# Patient Record
Sex: Male | Born: 1970 | Race: White | Hispanic: No | Marital: Married | Smoking: Former smoker
Health system: Southern US, Community
[De-identification: ages and names within clinical notes are randomized; demographics above are authoritative.]

## PROBLEM LIST (undated history)

## (undated) DIAGNOSIS — E785 Hyperlipidemia, unspecified: Secondary | ICD-10-CM

## (undated) DIAGNOSIS — J309 Allergic rhinitis, unspecified: Secondary | ICD-10-CM

## (undated) DIAGNOSIS — M545 Low back pain, unspecified: Secondary | ICD-10-CM

## (undated) DIAGNOSIS — S3609XA Other injury of spleen, initial encounter: Secondary | ICD-10-CM

## (undated) DIAGNOSIS — R350 Frequency of micturition: Secondary | ICD-10-CM

## (undated) DIAGNOSIS — T7840XA Allergy, unspecified, initial encounter: Secondary | ICD-10-CM

## (undated) DIAGNOSIS — A630 Anogenital (venereal) warts: Secondary | ICD-10-CM

## (undated) HISTORY — DX: Allergic rhinitis, unspecified: J30.9

## (undated) HISTORY — DX: Other injury of spleen, initial encounter: S36.09XA

## (undated) HISTORY — DX: Anogenital (venereal) warts: A63.0

## (undated) HISTORY — DX: Allergy, unspecified, initial encounter: T78.40XA

## (undated) HISTORY — DX: Low back pain: M54.5

## (undated) HISTORY — DX: Frequency of micturition: R35.0

## (undated) HISTORY — DX: Low back pain, unspecified: M54.50

## (undated) HISTORY — PX: TONSILLECTOMY: SHX5217

## (undated) HISTORY — DX: Hyperlipidemia, unspecified: E78.5

---

## 2003-05-14 HISTORY — PX: VARICOCELECTOMY: SHX1084

## 2005-05-13 HISTORY — PX: CYSTOSCOPY: SHX5120

## 2005-12-13 ENCOUNTER — Ambulatory Visit: Payer: Self-pay | Admitting: Cardiology

## 2008-10-21 ENCOUNTER — Encounter (INDEPENDENT_AMBULATORY_CARE_PROVIDER_SITE_OTHER): Payer: Self-pay | Admitting: *Deleted

## 2010-09-03 ENCOUNTER — Other Ambulatory Visit: Payer: Self-pay | Admitting: Family Medicine

## 2010-09-03 ENCOUNTER — Ambulatory Visit
Admission: RE | Admit: 2010-09-03 | Discharge: 2010-09-03 | Disposition: A | Discharge status: Home / Self Care | Payer: BC Managed Care – PPO | Source: Ambulatory Visit | Attending: Family Medicine | Admitting: Family Medicine

## 2010-09-03 DIAGNOSIS — M545 Low back pain, unspecified: Secondary | ICD-10-CM

## 2010-11-28 ENCOUNTER — Ambulatory Visit: Payer: Self-pay | Admitting: Family Medicine

## 2010-12-10 ENCOUNTER — Ambulatory Visit (INDEPENDENT_AMBULATORY_CARE_PROVIDER_SITE_OTHER): Payer: BC Managed Care – PPO | Admitting: Family Medicine

## 2010-12-10 ENCOUNTER — Encounter: Payer: Self-pay | Admitting: Family Medicine

## 2010-12-10 VITALS — BP 112/78 | HR 64 | Ht 71.0 in | Wt 175.0 lb

## 2010-12-10 DIAGNOSIS — M545 Low back pain, unspecified: Secondary | ICD-10-CM

## 2010-12-10 LAB — POCT URINALYSIS DIPSTICK
Blood, UA: NEGATIVE
Ketones, UA: NEGATIVE
Leukocytes, UA: NEGATIVE
Protein, UA: NEGATIVE
pH, UA: 7

## 2010-12-10 MED ORDER — NAPROXEN 500 MG PO TABS: 500.0000 mg | ORAL_TABLET | Freq: Two times a day (BID) | ORAL | Status: DC

## 2010-12-10 NOTE — Patient Instructions (Signed)
Back Pain (Lumbosacral Strain) Back pain is one of the most common causes of pain. There are many causes of back pain. Most are not serious conditions.  CAUSES Your backbone (spinal column) is made up of 24 main vertebral bodies, the sacrum, and the coccyx. These are held together by muscles and tough, fibrous tissue (ligaments). Nerve roots pass through the openings between the vertebrae. A sudden move or injury to the back may cause injury to, or pressure on, these nerves. This may result in localized back pain or pain movement (radiation) into the buttocks, down the leg, and into the foot. Sharp, shooting pain from the buttock down the back of the leg (sciatica) is frequently associated with a ruptured (herniated) disc. Pain may be caused by muscle spasm alone. Your caregiver can often find the cause of your pain by the details of your symptoms and an exam. In some cases, you may need tests (such as X-rays). Your caregiver will work with you to decide if any tests are needed based on your specific exam. HOME CARE INSTRUCTIONS  Avoid an underactive lifestyle. Active exercise, as directed by your caregiver, is your greatest weapon against back pain.   Avoid hard physical activities (tennis, racquetball, water-skiing) if you are not in proper physical condition for it. This may aggravate and/or create problems.   If you have a back problem, avoid sports requiring sudden body movements. Swimming and walking are generally safer activities.   Maintain good posture.   Avoid becoming overweight (obese).   Use bed rest for only the most extreme, sudden (acute) episode. Your caregiver will help you determine how much bed rest is necessary.   For acute conditions, you may put ice on the injured area.   Put ice in a plastic bag.   Place a towel between your skin and the bag.   Leave the ice on for 15 minutes at a time, every 2 hours, or as needed.   After you are improved and more active, it may  help to apply heat for 30 minutes before activities.  See your caregiver if you are having pain that lasts longer than expected. Your caregiver can advise appropriate exercises and/or therapy if needed. With conditioning, most back problems can be avoided. SEEK IMMEDIATE MEDICAL CARE IF:  You have numbness, tingling, weakness, or problems with the use of your arms or legs.   You experience severe back pain not relieved with medicines.   There is a change in bowel or bladder control.   You have increasing pain in any area of the body, including your belly (abdomen).   You notice shortness of breath, dizziness, or feel faint.   You feel sick to your stomach (nauseous), are throwing up (vomiting), or become sweaty.   You notice discoloration of your toes or legs, or your feet get very cold.   Your back pain is getting worse.   You have an oral temperature above 102, not controlled by medicine.  MAKE SURE YOU:   Understand these instructions.   Will watch your condition.   Will get help right away if you are not doing well or get worse.  Document Released: 02/06/2005 Document Re-Released: 07/24/2009 [facility] Patient Information 2011 [place], [place].

## 2010-12-10 NOTE — Progress Notes (Signed)
Chief Complaint:  Low back pain  Back pain began about a year ago, got better.  Flared again 6 months ago, and worsening over the last 4 months.  Pain is bilateral lower back. Pain radiates down to the calves when he extends his back.  Denies numbness/tingling, denies weakness.  Denies burning pain. He has seen Dr. Genene Churn (chiropractor) and gotten various treatments (including traction, per pts report).  Has not used any NSAIDs and prefers to use more natural, organic type treatments.  Would prefer to avoid surgery, steroid injections, and even medications, if possible, although he realizes at this point, having not improved with chiro treatments alone, that he will need to adjust these preferences.  Hurts getting out of bed in the morning, and with certain activities.  He is still going to the gym, but has avoided lower extremity weights, and quit running (per recommendation from chiro).  He has tried pilates and massage therapy as well. He reports that  X-rays shows DJD. Denies any bowel/bladder dysfunction.  History reviewed. No pertinent past medical history. No current outpatient prescriptions on file prior to visit.  No Known Allergies  ROS:  Denies fevers, URI symptoms, SOB, numbness/tingling/weakness, skin rash or other concerns  PHYSICAL EXAM: BP 112/78  Pulse 64  Ht 5\' 11"  (1.803 m)  Wt 175 lb (79.379 kg)  BMI 24.41 kg/m2 Well developed, pleasant male, in no distress Back: no spinal tenderness, no SI joint tenderness, no CVA tenderness.  No muscle spasm or paraspinal muscle tenderness.  No tenderness at sciatic notch . DTR's are 2+ and symmetric.  Normal strength, sensation.  Straight leg raise--caused pain in low back (bilateral) only.  Did not cause any radiation of pain into lower extremities Extremities: 2+ distal pulse, no edema Skin: without rash Psych: normal mood, affect, hygiene and grooming  ASSESSMENT/PLAN: 1. LBP (low back pain)  POCT urinalysis dipstick,  naproxen (NAPROSYN) 500 MG tablet, Ambulatory referral to Physical Therapy   NSAID precautions reviewed at length.  Will refer to [facility] (or [facility] if insurance issue).  Given normal exam with lack of neuro findings, will proceed with PT and NSAIDs before proceeding with MRI of L-S spine.  If no improvement, consider MRI  F/u at [facility] as already scheduled for September, sooner prn

## 2011-01-16 ENCOUNTER — Telehealth: Payer: Self-pay | Admitting: *Deleted

## 2011-01-16 ENCOUNTER — Ambulatory Visit (INDEPENDENT_AMBULATORY_CARE_PROVIDER_SITE_OTHER): Payer: BC Managed Care – PPO | Admitting: Family Medicine

## 2011-01-16 ENCOUNTER — Encounter: Payer: Self-pay | Admitting: Family Medicine

## 2011-01-16 VITALS — BP 110/70 | HR 60 | Ht 71.0 in | Wt 173.0 lb

## 2011-01-16 DIAGNOSIS — Z79899 Other long term (current) drug therapy: Secondary | ICD-10-CM

## 2011-01-16 DIAGNOSIS — J309 Allergic rhinitis, unspecified: Secondary | ICD-10-CM | POA: Insufficient documentation

## 2011-01-16 DIAGNOSIS — Z23 Encounter for immunization: Secondary | ICD-10-CM

## 2011-01-16 DIAGNOSIS — Z0000 Encounter for general adult medical examination without abnormal findings: Secondary | ICD-10-CM

## 2011-01-16 DIAGNOSIS — E7800 Pure hypercholesterolemia, unspecified: Secondary | ICD-10-CM

## 2011-01-16 DIAGNOSIS — M545 Low back pain: Secondary | ICD-10-CM

## 2011-01-16 LAB — COMPREHENSIVE METABOLIC PANEL
AST: 31 U/L (ref 0–37)
Alkaline Phosphatase: 48 U/L (ref 39–117)
BUN: 10 mg/dL (ref 6–23)
Calcium: 10.2 mg/dL (ref 8.4–10.5)
Chloride: 102 mEq/L (ref 96–112)
Creat: 0.93 mg/dL (ref 0.50–1.35)

## 2011-01-16 LAB — POCT URINALYSIS DIPSTICK
Bilirubin, UA: NEGATIVE
Blood, UA: NEGATIVE
Glucose, UA: NEGATIVE
Ketones, UA: NEGATIVE
Nitrite, UA: NEGATIVE
Spec Grav, UA: 1.01
pH, UA: 5

## 2011-01-16 LAB — LIPID PANEL
HDL: 56 mg/dL (ref >39)
Total CHOL/HDL Ratio: 3.7 Ratio
Triglycerides: 86 mg/dL (ref <150)

## 2011-01-16 MED ORDER — NAPROXEN 500 MG PO TABS: 500.0000 mg | ORAL_TABLET | Freq: Two times a day (BID) | ORAL | Status: AC

## 2011-01-16 NOTE — Progress Notes (Signed)
Kevin Lowe is a 40 y.o. male who presents for a complete physical.  He has the following concerns: Follow-up back pain.  He has been getting PT with Aart Schulenklopper, and also took Naproxen x 2 weeks.  Felt great while taking the NSAIDs, but had gradual return of pain since off NSAIDs.  Less pain in the leg, but still having pain in lower back.  Only occasional pain into the legs, overall improved. Denies weakness.  Would like MRI, and to restart NSAIDs   There is no immunization history on file for this patient. Will check old records for tetanus date--he can't recall Last colonoscopy: never Last PSA: N/A Dentist: twice yearly Ophtho: never Exercise: running 1-2 days/week, and weights and cardio 4x/week; since back pain x 4 mos, exercise has decreased, mainly weight training 3x/week,   Past Medical History  Diagnosis Date  . Low back pain   . Allergic rhinitis, cause unspecified   . Hyperlipidemia     Past Surgical History  Procedure Date  . Varicocelectomy 2005    L side  . Cystoscopy 2007    normal--for urinary frequency  . Tonsillectomy child    History   Social History  . Marital Status: Married    Spouse Name: N/A    Number of Children: N/A  . Years of Education: N/A   Occupational History  . Not on file.   Social History Main Topics  . Smoking status: Former Smoker    Quit date: 05/13/2000  . Smokeless tobacco: Never Used  . Alcohol Use: Yes     12 pack or more per week (6 pack/day on weekends)  . Drug Use: No  . Sexually Active: Not on file   Other Topics Concern  . Not on file   Social History Narrative  . No narrative on file    Family History  Problem Relation Age of Onset  . Cancer Mother     breast  . Heart disease Father     CABG in early 41's; pacemaker  . Depression Father   . Cancer Father 63    prostate cancer  . Heart disease Paternal Uncle     32's (died in 13's)  . Alzheimer's disease Maternal Grandmother   . Heart disease  Paternal Grandfather     died in mid-40's from heart disease  . Diabetes Neg Hx    Current outpatient prescriptions:fish oil-omega-3 fatty acids 1000 MG capsule, Take 2 g by mouth daily.  , Disp: , Rfl: ;  Multiple Vitamins-Minerals (MULTIVITAMIN WITH MINERALS) tablet, Take 1 tablet by mouth daily.  , Disp: , Rfl: ;  Ascorbic Acid (VITAMIN C) 1000 MG tablet, Take 1,000 mg by mouth daily.  , Disp: , Rfl:  fluticasone (FLONASE) 50 MCG/ACT nasal spray, Place 2 sprays into the nose daily. Uses prn for seasonal allergies , Disp: , Rfl:   No Known Allergies  ROS: The patient denies anorexia, fever,  headaches,  vision loss, decreased hearing, ear pain, hoarseness, chest pain, palpitations, dizziness, syncope, dyspnea on exertion, cough, swelling, nausea, vomiting, diarrhea, constipation, abdominal pain, melena, hematochezia, indigestion/heartburn, hematuria, incontinence, erectile dysfunction, nocturia, weakened urine stream, dysuria, genital lesions, numbness, tingling, weakness, tremor, suspicious skin lesions, depression, abnormal bleeding/bruising, or enlarged lymph nodes  Intentional 8-10 pound weight loss over the last 6 months (healthy diet changes); occasional trouble sleeping related to anxiety. +back pain  PHYSICAL EXAM: BP 110/70  Pulse 60  Ht 5\' 11"  (1.803 m)  Wt 173 lb (78.472 kg)  BMI 24.13 kg/m2  General Appearance:    Alert, cooperative, no distress, appears stated age  Head:    Normocephalic, without obvious abnormality, atraumatic  Eyes:    PERRL, conjunctiva/corneas clear, EOM's intact, fundi    benign  Ears:    Normal TM's and external ear canals  Nose:   Nares normal, mucosa normal, no drainage or sinus   tenderness  Throat:   Lips, mucosa, and tongue normal; teeth and gums normal  Neck:   Supple, no lymphadenopathy;  thyroid:  no   enlargement/tenderness/nodules; no carotid   bruit or JVD  Back:    Spine nontender, no curvature, ROM normal, no CVA     Tenderness.  Area of  pain is across low back bilaterally, but nontender  Lungs:     Clear to auscultation bilaterally without wheezes, rales or     ronchi; respirations unlabored  Chest Wall:    No tenderness or deformity   Heart:    Regular rate and rhythm, S1 and S2 normal, no murmur, rub   or gallop  Breast Exam:    No chest wall tenderness, masses or gynecomastia  Abdomen:     Soft, non-tender, nondistended, normoactive bowel sounds,    no masses, no hepatosplenomegaly  Genitalia:    Normal male external genitalia without lesions.  Testicles without masses.  No inguinal hernias.  Rectal:   Deferred due to age <40 and lack of symptoms  Extremities:   No clubbing, cyanosis or edema  Pulses:   2+ and symmetric all extremities  Skin:   Skin color, texture, turgor normal, no rashes or lesions  Lymph nodes:   Cervical, supraclavicular, and axillary nodes normal  Neurologic:   CNII-XII intact, normal strength, sensation and gait; reflexes 2+ and symmetric throughout. Negative straight leg raise (some discomfort in low back, not in legs)          Psych:   Normal mood, affect, hygiene and grooming.     ASSESSMENT/PLAN: 1. Routine general medical examination at a health care facility  POCT Urinalysis Dipstick, Visual acuity screening  2. Low back pain  naproxen (NAPROSYN) 500 MG tablet, MR Lumbar Spine Wo Contrast   temporarily improved with NSAIDs, but recurred, despite PT.  Check MRI  3. Allergic rhinitis, cause unspecified     currently asymptomatic.  Restart Flonase and antihistamines when symptoms recur  4. Encounter for long-term (current) use of other medications  Comprehensive metabolic panel  5. Pure hypercholesterolemia  Lipid panel   improved with dietary measures in the past.  Normal HS-CRP in past--due for re-check of lipids   Recommended at least 30 minutes of aerobic activity at least 5 days/week; proper sunscreen use reviewed; PSA screening will be discussed at age 28 (due to family history);  healthy diet and alcohol recommendations (less than or equal to 2 drinks/day) reviewed; regular seatbelt use; changing batteries in smoke detectors. Self-testicular exams. Immunization recommendations discussed--old records reviewed, and no mention of Td or Tdap.  Will need to return for TdaP at nurse visit.  Colonoscopy recommendations reviewed--age 52.

## 2011-01-16 NOTE — Patient Instructions (Addendum)

## 2011-01-16 NOTE — Telephone Encounter (Signed)
Called patient and left message for patient regarding his MRI appt. He is scheduled at [facility] @ [address] 01/21/11 arrive @ 7:15am for a 7:45am scan. Also called his [employer] and got a prior auth, the number is [phone] good today-02/14/11, called Kristy at [facility] to give her this number.

## 2011-01-16 NOTE — Telephone Encounter (Signed)
Called patient to ask him when his last tetanus injection was. Patient states that he has no idea.

## 2011-01-16 NOTE — Telephone Encounter (Signed)
Called patient and left him a message to come in for a Tdap when he could and to call for an appointment for this injection.

## 2011-01-16 NOTE — Telephone Encounter (Signed)
Advise pt that I reviewed records sent from [facility].  I didn't give him tetanus there--if he doesn't know, and is likely >10 years, then have him come for TdaP at his convenience

## 2011-01-17 ENCOUNTER — Encounter: Payer: Self-pay | Admitting: Family Medicine

## 2011-01-23 ENCOUNTER — Encounter: Payer: Self-pay | Admitting: Family Medicine

## 2011-01-23 ENCOUNTER — Ambulatory Visit
Admission: RE | Admit: 2011-01-23 | Discharge: 2011-01-23 | Disposition: A | Discharge status: Home / Self Care | Payer: BC Managed Care – PPO | Source: Ambulatory Visit | Attending: Family Medicine | Admitting: Family Medicine

## 2011-01-23 DIAGNOSIS — M545 Low back pain: Secondary | ICD-10-CM

## 2011-01-25 ENCOUNTER — Other Ambulatory Visit (INDEPENDENT_AMBULATORY_CARE_PROVIDER_SITE_OTHER): Payer: BC Managed Care – PPO

## 2011-01-25 DIAGNOSIS — Z9229 Personal history of other drug therapy: Secondary | ICD-10-CM

## 2011-01-25 DIAGNOSIS — Z23 Encounter for immunization: Secondary | ICD-10-CM

## 2011-02-04 ENCOUNTER — Telehealth: Payer: Self-pay | Admitting: *Deleted

## 2011-02-04 NOTE — Telephone Encounter (Signed)
Called patient and let him know that the other group that Dr.Knapp had mentioned was [facility], and she is familiar with Dr's Lovell Sheehan and [employer].

## 2011-07-22 ENCOUNTER — Other Ambulatory Visit: Payer: BC Managed Care – PPO

## 2011-07-22 DIAGNOSIS — E7800 Pure hypercholesterolemia, unspecified: Secondary | ICD-10-CM

## 2011-07-23 LAB — LIPID PANEL
Cholesterol: 204 mg/dL — ABNORMAL HIGH (ref 0–200)
VLDL: 22 mg/dL (ref 0–40)

## 2011-07-24 ENCOUNTER — Other Ambulatory Visit: Payer: Self-pay | Admitting: *Deleted

## 2011-07-24 DIAGNOSIS — E7800 Pure hypercholesterolemia, unspecified: Secondary | ICD-10-CM

## 2011-07-25 ENCOUNTER — Other Ambulatory Visit: Payer: Self-pay | Admitting: *Deleted

## 2012-01-22 ENCOUNTER — Ambulatory Visit
Admission: RE | Admit: 2012-01-22 | Discharge: 2012-01-22 | Disposition: A | Discharge status: Home / Self Care | Payer: BC Managed Care – PPO | Source: Ambulatory Visit | Attending: Neurosurgery | Admitting: Neurosurgery

## 2012-01-22 ENCOUNTER — Other Ambulatory Visit: Payer: Self-pay | Admitting: Neurosurgery

## 2012-01-22 DIAGNOSIS — M545 Low back pain: Secondary | ICD-10-CM

## 2012-01-23 ENCOUNTER — Other Ambulatory Visit: Payer: Self-pay | Admitting: Neurosurgery

## 2012-02-04 ENCOUNTER — Encounter (HOSPITAL_COMMUNITY): Payer: Self-pay | Admitting: Pharmacy Technician

## 2012-02-11 ENCOUNTER — Encounter (HOSPITAL_COMMUNITY): Payer: Self-pay

## 2012-02-11 ENCOUNTER — Encounter (HOSPITAL_COMMUNITY)
Admission: RE | Admit: 2012-02-11 | Discharge: 2012-02-11 | Disposition: A | Discharge status: Home / Self Care | Payer: BC Managed Care – PPO | Source: Ambulatory Visit | Attending: Neurosurgery | Admitting: Neurosurgery

## 2012-02-11 HISTORY — PX: LUMBAR LAMINECTOMY/DECOMPRESSION MICRODISCECTOMY: SHX5026

## 2012-02-11 LAB — BASIC METABOLIC PANEL
BUN: 13 mg/dL (ref 6–23)
CO2: 30 mEq/L (ref 19–32)
Chloride: 101 mEq/L (ref 96–112)
Creatinine, Ser: 0.9 mg/dL (ref 0.50–1.35)
Glucose, Bld: 103 mg/dL — ABNORMAL HIGH (ref 70–99)
Potassium: 4.1 mEq/L (ref 3.5–5.1)

## 2012-02-11 LAB — CBC
HCT: 46.5 % (ref 39.0–52.0)
Hemoglobin: 16.6 g/dL (ref 13.0–17.0)
MCH: 31.4 pg (ref 26.0–34.0)
MCHC: 35.7 g/dL (ref 30.0–36.0)
MCV: 88.1 fL (ref 78.0–100.0)
RDW: 11.8 % (ref 11.5–15.5)

## 2012-02-11 LAB — SURGICAL PCR SCREEN: MRSA, PCR: NEGATIVE

## 2012-02-11 NOTE — Pre-Procedure Instructions (Signed)
20 MAHAMUD FRENZ  02/11/2012   Your procedure is scheduled on:  02/18/12  Report to [facility] at 1240 AM.  Call this number if you have problems the morning of surgery: [phone]   Remember:   Do not eat food:After Midnight.     Take these medicines the morning of surgery with A SIP OF WATER: none   Do not wear jewelry, make-up or nail polish.  Do not wear lotions, powders, or perfumes. You may wear deodorant.  Do not shave 48 hours prior to surgery. Men may shave face and neck.  Do not bring valuables to the hospital.  Contacts, dentures or bridgework may not be worn into surgery.  Leave suitcase in the car. After surgery it may be brought to your room.  For patients admitted to the hospital, checkout time is 11:00 AM the day of discharge.   Patients discharged the day of surgery will not be allowed to drive home.  Name and phone number of your driver: family  Special Instructions: Shower using CHG 2 nights before surgery and the night before surgery.  If you shower the day of surgery use CHG.  Use special wash - you have one bottle of CHG for all showers.  You should use approximately 1/3 of the bottle for each shower.   Please read over the following fact sheets that you were given: Pain Booklet, Coughing and Deep Breathing, MRSA Information and Surgical Site Infection Prevention

## 2012-02-17 MED ORDER — CEFAZOLIN SODIUM-DEXTROSE 2-3 GM-% IV SOLR
2.0000 g | INTRAVENOUS | Status: AC
  Administered 2012-02-18: 2 g via INTRAVENOUS
  Filled 2012-02-17: qty 50

## 2012-02-18 ENCOUNTER — Observation Stay (HOSPITAL_COMMUNITY)
Admission: RE | Admit: 2012-02-18 | Discharge: 2012-02-19 | Disposition: A | Discharge status: Home / Self Care | Payer: BC Managed Care – PPO | Source: Ambulatory Visit | Attending: Neurosurgery | Admitting: Neurosurgery

## 2012-02-18 ENCOUNTER — Encounter (HOSPITAL_COMMUNITY): Payer: Self-pay | Admitting: *Deleted

## 2012-02-18 ENCOUNTER — Encounter (HOSPITAL_COMMUNITY)
Admission: RE | Disposition: A | Discharge status: Home / Self Care | Payer: Self-pay | Source: Ambulatory Visit | Attending: Neurosurgery

## 2012-02-18 ENCOUNTER — Ambulatory Visit (HOSPITAL_COMMUNITY): Payer: BC Managed Care – PPO | Admitting: *Deleted

## 2012-02-18 ENCOUNTER — Ambulatory Visit (HOSPITAL_COMMUNITY): Payer: BC Managed Care – PPO

## 2012-02-18 DIAGNOSIS — M5126 Other intervertebral disc displacement, lumbar region: Principal | ICD-10-CM | POA: Insufficient documentation

## 2012-02-18 DIAGNOSIS — M48061 Spinal stenosis, lumbar region without neurogenic claudication: Secondary | ICD-10-CM | POA: Insufficient documentation

## 2012-02-18 DIAGNOSIS — Z01812 Encounter for preprocedural laboratory examination: Secondary | ICD-10-CM | POA: Insufficient documentation

## 2012-02-18 HISTORY — PX: LUMBAR LAMINECTOMY/DECOMPRESSION MICRODISCECTOMY: SHX5026

## 2012-02-18 SURGERY — LUMBAR LAMINECTOMY/DECOMPRESSION MICRODISCECTOMY
Anesthesia: General | Site: Back | Laterality: Right | Wound class: Clean

## 2012-02-18 MED ORDER — ONDANSETRON HCL 4 MG/2ML IJ SOLN
INTRAMUSCULAR | Status: DC | PRN
  Administered 2012-02-18: 4 mg via INTRAVENOUS

## 2012-02-18 MED ORDER — FENTANYL CITRATE 0.05 MG/ML IJ SOLN
INTRAMUSCULAR | Status: AC
  Filled 2012-02-18: qty 2

## 2012-02-18 MED ORDER — SODIUM CHLORIDE 0.9 % IV SOLN: 250.0000 mL | INTRAVENOUS | Status: DC

## 2012-02-18 MED ORDER — OXYCODONE-ACETAMINOPHEN 5-325 MG PO TABS
1.0000 | ORAL_TABLET | ORAL | Status: DC | PRN
  Administered 2012-02-18 – 2012-02-19 (×4): 2 via ORAL
  Filled 2012-02-18 (×4): qty 2

## 2012-02-18 MED ORDER — DEXAMETHASONE SODIUM PHOSPHATE 4 MG/ML IJ SOLN
INTRAMUSCULAR | Status: DC | PRN
  Administered 2012-02-18: 4 mg via INTRAVENOUS

## 2012-02-18 MED ORDER — MEPERIDINE HCL 25 MG/ML IJ SOLN: 6.2500 mg | INTRAMUSCULAR | Status: DC | PRN

## 2012-02-18 MED ORDER — SODIUM CHLORIDE 0.9 % IJ SOLN
3.0000 mL | Freq: Two times a day (BID) | INTRAMUSCULAR | Status: DC
  Administered 2012-02-19: 3 mL via INTRAVENOUS

## 2012-02-18 MED ORDER — LIDOCAINE-EPINEPHRINE 1 %-1:100000 IJ SOLN
INTRAMUSCULAR | Status: DC | PRN
  Administered 2012-02-18: 3.5 mL

## 2012-02-18 MED ORDER — MIDAZOLAM HCL 5 MG/5ML IJ SOLN
INTRAMUSCULAR | Status: DC | PRN
  Administered 2012-02-18: 2 mg via INTRAVENOUS

## 2012-02-18 MED ORDER — INFLUENZA VIRUS VACC SPLIT PF IM SUSP
0.5000 mL | INTRAMUSCULAR | Status: AC
  Administered 2012-02-19: 0.5 mL via INTRAMUSCULAR
  Filled 2012-02-18: qty 0.5

## 2012-02-18 MED ORDER — HEMOSTATIC AGENTS (NO CHARGE) OPTIME
TOPICAL | Status: DC | PRN
  Administered 2012-02-18: 1 via TOPICAL

## 2012-02-18 MED ORDER — DIAZEPAM 5 MG PO TABS
5.0000 mg | ORAL_TABLET | Freq: Four times a day (QID) | ORAL | Status: DC | PRN
  Administered 2012-02-19 (×2): 5 mg via ORAL
  Filled 2012-02-18 (×2): qty 1

## 2012-02-18 MED ORDER — ACETAMINOPHEN 325 MG PO TABS: 650.0000 mg | ORAL_TABLET | ORAL | Status: DC | PRN

## 2012-02-18 MED ORDER — SODIUM CHLORIDE 0.9 % IJ SOLN: 3.0000 mL | INTRAMUSCULAR | Status: DC | PRN

## 2012-02-18 MED ORDER — NEOSTIGMINE METHYLSULFATE 1 MG/ML IJ SOLN
INTRAMUSCULAR | Status: DC | PRN
  Administered 2012-02-18: 2 mg via INTRAVENOUS

## 2012-02-18 MED ORDER — LIDOCAINE HCL 4 % MT SOLN
OROMUCOSAL | Status: DC | PRN
  Administered 2012-02-18: 4 mL via TOPICAL

## 2012-02-18 MED ORDER — ROCURONIUM BROMIDE 100 MG/10ML IV SOLN
INTRAVENOUS | Status: DC | PRN
  Administered 2012-02-18: 50 mg via INTRAVENOUS

## 2012-02-18 MED ORDER — MORPHINE SULFATE 2 MG/ML IJ SOLN: 1.0000 mg | INTRAMUSCULAR | Status: DC | PRN

## 2012-02-18 MED ORDER — ACETAMINOPHEN 650 MG RE SUPP: 650.0000 mg | RECTAL | Status: DC | PRN

## 2012-02-18 MED ORDER — BUPIVACAINE HCL (PF) 0.5 % IJ SOLN
INTRAMUSCULAR | Status: DC | PRN
  Administered 2012-02-18: 3.5 mL

## 2012-02-18 MED ORDER — BACITRACIN 50000 UNITS IM SOLR
INTRAMUSCULAR | Status: DC | PRN
  Administered 2012-02-18: 15:00:00

## 2012-02-18 MED ORDER — MENTHOL 3 MG MT LOZG: 1.0000 | LOZENGE | OROMUCOSAL | Status: DC | PRN

## 2012-02-18 MED ORDER — HYDROMORPHONE HCL PF 1 MG/ML IJ SOLN
INTRAMUSCULAR | Status: AC
  Filled 2012-02-18: qty 1

## 2012-02-18 MED ORDER — HYDROCODONE-ACETAMINOPHEN 5-325 MG PO TABS: 1.0000 | ORAL_TABLET | ORAL | Status: DC | PRN

## 2012-02-18 MED ORDER — FENTANYL CITRATE 0.05 MG/ML IJ SOLN
INTRAMUSCULAR | Status: DC | PRN
  Administered 2012-02-18: 100 ug via INTRAVENOUS

## 2012-02-18 MED ORDER — METHOCARBAMOL 100 MG/ML IJ SOLN: 500.0000 mg | Freq: Four times a day (QID) | INTRAVENOUS | Status: DC | PRN

## 2012-02-18 MED ORDER — LACTATED RINGERS IV SOLN
INTRAVENOUS | Status: DC | PRN
  Administered 2012-02-18 (×2): via INTRAVENOUS

## 2012-02-18 MED ORDER — ONDANSETRON HCL 4 MG/2ML IJ SOLN: 4.0000 mg | INTRAMUSCULAR | Status: DC | PRN

## 2012-02-18 MED ORDER — METHOCARBAMOL 500 MG PO TABS
500.0000 mg | ORAL_TABLET | Freq: Four times a day (QID) | ORAL | Status: DC | PRN
  Filled 2012-02-18: qty 1

## 2012-02-18 MED ORDER — OXYCODONE HCL 5 MG PO TABS
ORAL_TABLET | ORAL | Status: AC
  Filled 2012-02-18: qty 1

## 2012-02-18 MED ORDER — LIDOCAINE HCL 1 % IJ SOLN
INTRAMUSCULAR | Status: DC | PRN
  Administered 2012-02-18: 100 mg via INTRADERMAL

## 2012-02-18 MED ORDER — METHYLPREDNISOLONE ACETATE 80 MG/ML IJ SUSP
INTRAMUSCULAR | Status: DC | PRN
  Administered 2012-02-18: 80 mg

## 2012-02-18 MED ORDER — PHENOL 1.4 % MT LIQD: 1.0000 | OROMUCOSAL | Status: DC | PRN

## 2012-02-18 MED ORDER — FENTANYL CITRATE 0.05 MG/ML IJ SOLN
INTRAMUSCULAR | Status: DC | PRN
  Administered 2012-02-18: 100 ug via INTRAVENOUS
  Administered 2012-02-18: 50 ug via INTRAVENOUS
  Administered 2012-02-18: 100 ug via INTRAVENOUS

## 2012-02-18 MED ORDER — 0.9 % SODIUM CHLORIDE (POUR BTL) OPTIME
TOPICAL | Status: DC | PRN
  Administered 2012-02-18: 1000 mL

## 2012-02-18 MED ORDER — KCL IN DEXTROSE-NACL 20-5-0.45 MEQ/L-%-% IV SOLN
INTRAVENOUS | Status: DC
  Administered 2012-02-18 (×2): via INTRAVENOUS
  Filled 2012-02-18 (×3): qty 1000

## 2012-02-18 MED ORDER — KETOROLAC TROMETHAMINE 30 MG/ML IJ SOLN
INTRAMUSCULAR | Status: DC | PRN
  Administered 2012-02-18: 30 mg via INTRAVENOUS

## 2012-02-18 MED ORDER — ONDANSETRON HCL 4 MG/2ML IJ SOLN: 4.0000 mg | Freq: Once | INTRAMUSCULAR | Status: DC | PRN

## 2012-02-18 MED ORDER — THROMBIN 5000 UNITS EX SOLR
CUTANEOUS | Status: DC | PRN
  Administered 2012-02-18 (×2): 5000 [IU] via TOPICAL

## 2012-02-18 MED ORDER — GLYCOPYRROLATE 0.2 MG/ML IJ SOLN
INTRAMUSCULAR | Status: DC | PRN
  Administered 2012-02-18 (×2): 0.2 mg via INTRAVENOUS

## 2012-02-18 MED ORDER — PANTOPRAZOLE SODIUM 40 MG IV SOLR
40.0000 mg | Freq: Every day | INTRAVENOUS | Status: DC
  Administered 2012-02-18: 40 mg via INTRAVENOUS
  Filled 2012-02-18 (×2): qty 40

## 2012-02-18 MED ORDER — KCL IN DEXTROSE-NACL 20-5-0.45 MEQ/L-%-% IV SOLN
INTRAVENOUS | Status: AC
  Filled 2012-02-18: qty 1000

## 2012-02-18 MED ORDER — METHOCARBAMOL 100 MG/ML IJ SOLN
500.0000 mg | INTRAVENOUS | Status: DC
  Administered 2012-02-18: 500 mg via INTRAVENOUS
  Filled 2012-02-18: qty 5

## 2012-02-18 MED ORDER — OXYCODONE HCL 5 MG PO TABS
5.0000 mg | ORAL_TABLET | Freq: Once | ORAL | Status: AC | PRN
  Administered 2012-02-18: 5 mg via ORAL

## 2012-02-18 MED ORDER — ARTIFICIAL TEARS OP OINT
TOPICAL_OINTMENT | OPHTHALMIC | Status: DC | PRN
  Administered 2012-02-18: 1 via OPHTHALMIC

## 2012-02-18 MED ORDER — ZOLPIDEM TARTRATE 5 MG PO TABS: 5.0000 mg | ORAL_TABLET | Freq: Every evening | ORAL | Status: DC | PRN

## 2012-02-18 MED ORDER — OXYCODONE HCL 5 MG/5ML PO SOLN: 5.0000 mg | Freq: Once | ORAL | Status: AC | PRN

## 2012-02-18 MED ORDER — HYDROMORPHONE HCL PF 1 MG/ML IJ SOLN
0.2500 mg | INTRAMUSCULAR | Status: DC | PRN
  Administered 2012-02-18 (×4): 0.5 mg via INTRAVENOUS

## 2012-02-18 MED ORDER — PROPOFOL 10 MG/ML IV BOLUS
INTRAVENOUS | Status: DC | PRN
  Administered 2012-02-18: 50 mg via INTRAVENOUS

## 2012-02-18 MED ORDER — VITAMIN C 500 MG PO TABS
1000.0000 mg | ORAL_TABLET | Freq: Every day | ORAL | Status: DC
  Administered 2012-02-18 – 2012-02-19 (×2): 1000 mg via ORAL
  Filled 2012-02-18 (×2): qty 2

## 2012-02-18 MED ORDER — CEFAZOLIN SODIUM 1-5 GM-% IV SOLN
1.0000 g | Freq: Three times a day (TID) | INTRAVENOUS | Status: AC
  Administered 2012-02-18 – 2012-02-19 (×2): 1 g via INTRAVENOUS
  Filled 2012-02-18 (×2): qty 50

## 2012-02-18 MED ORDER — DOCUSATE SODIUM 100 MG PO CAPS
100.0000 mg | ORAL_CAPSULE | Freq: Two times a day (BID) | ORAL | Status: DC
  Administered 2012-02-19: 100 mg via ORAL
  Filled 2012-02-18 (×2): qty 1

## 2012-02-18 MED ORDER — ADULT MULTIVITAMIN W/MINERALS CH
1.0000 | ORAL_TABLET | Freq: Every day | ORAL | Status: DC
  Administered 2012-02-18 – 2012-02-19 (×2): 1 via ORAL
  Filled 2012-02-18 (×2): qty 1

## 2012-02-18 SURGICAL SUPPLY — 61 items
BAG DECANTER FOR FLEXI CONT (MISCELLANEOUS) ×2 IMPLANT
BENZOIN TINCTURE PRP APPL 2/3 (GAUZE/BANDAGES/DRESSINGS) IMPLANT
BIT DRILL NEURO 2X3.1 SFT TUCH (MISCELLANEOUS) ×1 IMPLANT
BLADE SURG ROTATE 9660 (MISCELLANEOUS) ×2 IMPLANT
BUR ROUND FLUTED 5 RND (BURR) ×2 IMPLANT
CANISTER SUCTION 2500CC (MISCELLANEOUS) ×2 IMPLANT
CLOTH BEACON ORANGE TIMEOUT ST (SAFETY) ×2 IMPLANT
CONT SPEC 4OZ CLIKSEAL STRL BL (MISCELLANEOUS) IMPLANT
CORDS BIPOLAR (ELECTRODE) ×2 IMPLANT
DERMABOND ADHESIVE PROPEN (GAUZE/BANDAGES/DRESSINGS) ×1
DERMABOND ADVANCED (GAUZE/BANDAGES/DRESSINGS) ×1
DERMABOND ADVANCED .7 DNX12 (GAUZE/BANDAGES/DRESSINGS) ×1 IMPLANT
DERMABOND ADVANCED .7 DNX6 (GAUZE/BANDAGES/DRESSINGS) ×1 IMPLANT
DRAPE LAPAROTOMY 100X72X124 (DRAPES) ×2 IMPLANT
DRAPE MICROSCOPE LEICA (MISCELLANEOUS) ×2 IMPLANT
DRAPE POUCH INSTRU U-SHP 10X18 (DRAPES) ×2 IMPLANT
DRESSING TELFA 8X3 (GAUZE/BANDAGES/DRESSINGS) IMPLANT
DRILL NEURO 2X3.1 SOFT TOUCH (MISCELLANEOUS) ×2
DURAPREP 26ML APPLICATOR (WOUND CARE) ×2 IMPLANT
ELECT REM PT RETURN 9FT ADLT (ELECTROSURGICAL) ×2
ELECTRODE REM PT RTRN 9FT ADLT (ELECTROSURGICAL) ×1 IMPLANT
GAUZE SPONGE 4X4 16PLY XRAY LF (GAUZE/BANDAGES/DRESSINGS) IMPLANT
GLOVE BIO SURGEON STRL SZ8 (GLOVE) ×2 IMPLANT
GLOVE BIOGEL M 8.0 STRL (GLOVE) ×2 IMPLANT
GLOVE BIOGEL PI IND STRL 7.0 (GLOVE) ×2 IMPLANT
GLOVE BIOGEL PI IND STRL 7.5 (GLOVE) ×2 IMPLANT
GLOVE BIOGEL PI IND STRL 8 (GLOVE) ×1 IMPLANT
GLOVE BIOGEL PI IND STRL 8.5 (GLOVE) ×1 IMPLANT
GLOVE BIOGEL PI INDICATOR 7.0 (GLOVE) ×2
GLOVE BIOGEL PI INDICATOR 7.5 (GLOVE) ×2
GLOVE BIOGEL PI INDICATOR 8 (GLOVE) ×1
GLOVE BIOGEL PI INDICATOR 8.5 (GLOVE) ×1
GLOVE ECLIPSE 7.5 STRL STRAW (GLOVE) ×6 IMPLANT
GLOVE EXAM NITRILE LRG STRL (GLOVE) IMPLANT
GLOVE EXAM NITRILE MD LF STRL (GLOVE) IMPLANT
GLOVE EXAM NITRILE XL STR (GLOVE) IMPLANT
GLOVE EXAM NITRILE XS STR PU (GLOVE) IMPLANT
GLOVE SURG SS PI 6.5 STRL IVOR (GLOVE) ×4 IMPLANT
GOWN BRE IMP SLV AUR LG STRL (GOWN DISPOSABLE) ×4 IMPLANT
GOWN BRE IMP SLV AUR XL STRL (GOWN DISPOSABLE) ×6 IMPLANT
GOWN STRL REIN 2XL LVL4 (GOWN DISPOSABLE) ×2 IMPLANT
KIT BASIN OR (CUSTOM PROCEDURE TRAY) ×2 IMPLANT
KIT ROOM TURNOVER OR (KITS) ×2 IMPLANT
NEEDLE HYPO 18GX1.5 BLUNT FILL (NEEDLE) ×2 IMPLANT
NEEDLE HYPO 25X1 1.5 SAFETY (NEEDLE) ×2 IMPLANT
NS IRRIG 1000ML POUR BTL (IV SOLUTION) ×2 IMPLANT
PACK LAMINECTOMY NEURO (CUSTOM PROCEDURE TRAY) ×2 IMPLANT
PAD ARMBOARD 7.5X6 YLW CONV (MISCELLANEOUS) ×6 IMPLANT
SPONGE GAUZE 4X4 12PLY (GAUZE/BANDAGES/DRESSINGS) IMPLANT
SPONGE SURGIFOAM ABS GEL SZ50 (HEMOSTASIS) ×2 IMPLANT
STAPLER SKIN PROX WIDE 3.9 (STAPLE) IMPLANT
STRIP CLOSURE SKIN 1/2X4 (GAUZE/BANDAGES/DRESSINGS) IMPLANT
SUT VIC AB 0 CT1 18XCR BRD8 (SUTURE) ×1 IMPLANT
SUT VIC AB 0 CT1 8-18 (SUTURE) ×1
SUT VIC AB 2-0 CT1 18 (SUTURE) ×2 IMPLANT
SUT VIC AB 3-0 SH 8-18 (SUTURE) ×2 IMPLANT
SYR 20CC LL (SYRINGE) ×2 IMPLANT
SYR 5ML LL (SYRINGE) ×2 IMPLANT
TOWEL OR 17X24 6PK STRL BLUE (TOWEL DISPOSABLE) ×2 IMPLANT
TOWEL OR 17X26 10 PK STRL BLUE (TOWEL DISPOSABLE) ×2 IMPLANT
WATER STERILE IRR 1000ML POUR (IV SOLUTION) ×2 IMPLANT

## 2012-02-18 NOTE — Anesthesia Preprocedure Evaluation (Addendum)
Anesthesia Evaluation  Patient identified by MRN, date of birth, ID band Patient awake    Reviewed: Allergy & Precautions, H&P , NPO status , Patient's Chart, lab work & pertinent test results  History of Anesthesia Complications Negative for: history of anesthetic complications  Airway Mallampati: II TM Distance: >3 FB Neck ROM: Full    Dental  (+) Teeth Intact and Dental Advisory Given   Pulmonary neg pulmonary ROS,          Cardiovascular negative cardio ROS      Neuro/Psych  Neuromuscular disease (pain BLE) negative psych ROS   GI/Hepatic negative GI ROS, Neg liver ROS,   Endo/Other  negative endocrine ROS  Renal/GU negative Renal ROS  negative genitourinary   Musculoskeletal negative musculoskeletal ROS (+)   Abdominal   Peds negative pediatric ROS (+)  Hematology negative hematology ROS (+)   Anesthesia Other Findings   Reproductive/Obstetrics                          Anesthesia Physical Anesthesia Plan  ASA: II  Anesthesia Plan: General   Post-op Pain Management:    Induction: Intravenous  Airway Management Planned: Oral ETT  Additional Equipment:   Intra-op Plan:   Post-operative Plan: Extubation in OR  Informed Consent: I have reviewed the patients History and Physical, chart, labs and discussed the procedure including the risks, benefits and alternatives for the proposed anesthesia with the patient or authorized representative who has indicated his/her understanding and acceptance.   Dental advisory given  Plan Discussed with: CRNA and Surgeon  Anesthesia Plan Comments:        Anesthesia Quick Evaluation

## 2012-02-18 NOTE — Progress Notes (Signed)
Incisional soreness, no leg pain or numbness.  Full strength in both lower extremities.  Doing well.

## 2012-02-18 NOTE — Anesthesia Postprocedure Evaluation (Signed)
Anesthesia Post Note  Patient: Kevin Lowe  Procedure(s) Performed: Procedure(s) (LRB): LUMBAR LAMINECTOMY/DECOMPRESSION MICRODISCECTOMY (Right)  Anesthesia type: general  Patient location: PACU  Post pain: Pain level controlled  Post assessment: Patient's Cardiovascular Status Stable  Last Vitals:  Filed Vitals:   02/18/12 1615  BP: 130/83  Pulse: 65  Temp:   Resp: 11    Post vital signs: Reviewed and stable  Level of consciousness: sedated  Complications: No apparent anesthesia complications

## 2012-02-18 NOTE — Op Note (Signed)
02/18/2012  3:46 PM  PATIENT:  Kevin Lowe  41 y.o. 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, lumbar stenosis, lumbar radiculopathy L 45  PROCEDURE:  Procedure(s) (LRB) with comments: LUMBAR LAMINECTOMY/DECOMPRESSION MICRODISCECTOMY (Right) - Right Lumbar four-five Microdiskectomy  SURGEON:  Surgeon(s) and Role:    * Maeola Harman, MD - Primary    * Karn Cassis, MD - Assisting  PHYSICIAN ASSISTANT:   ASSISTANTS: None   ANESTHESIA:   general  EBL:  Total I/O In: 1300 [I.V.:1300] Out: 50 [Blood:50]  BLOOD ADMINISTERED:none  DRAINS: none   LOCAL MEDICATIONS USED:  LIDOCAINE   SPECIMEN:  No Specimen  DISPOSITION OF SPECIMEN:  N/A  COUNTS:  YES  TOURNIQUET:  * No tourniquets in log *  DICTATION: Patient has a large L4-5 disc rupture in the midline extending to the right with significant right leg weakness. It was elected to take him to surgery for right L4-5 microdiscectomy.  Procedure: Patient was brought to the operating room and following the smooth and uncomplicated induction of general endotracheal anesthesia he was placed in a prone position on the Wilson frame. Low back was prepped and draped in the usual sterile fashion with DuraPrep. Area of planned incision was infiltrated with local lidocaine. Incision was made in the midline and carried to the lumbodorsal fascia which was incised on the right side of midline. Subperiosteal dissection was performed exposing what was felt to be L45 level. Intraoperative x-ray demonstrated marker probes at L 5-S1 and L4-5.  A hemi-semi-laminectomy of L4 was performed a high-speed drill and completed with Kerrison rongeurs and a generous foraminotomy was performed overlying the superior aspect of the L5 lamina. Ligamentum flavum was detached and  removed in a piecemeal fashion and the L5 nerve root was decompressed laterally with removal of the superior aspect of the facet and ligamentum causing nerve root compression. The microscope was brought into the field and the L5 nerve root and thecal sac were mobilized medially. This exposed a large amount of soft disc material. Multiple fragments were removed and these extended into the interspace which appeared to be quite soft with a disrupted annulus overlying the interspace. As a result it was elected to further decompress the interspace and remove loose disc material and this was done lightly with a variety Epstein curettes and pituitary rongeurs. The redundant annulus was also removed and shrunk with Bipolar cautery.  At this point it was felt that all neural elements were well decompressed and the thecal sac was significantly decompressedand there was no evidence of residual loose disc material within the interspace. The interspace was then irrigated with bacitracin saline and no additional disc material was mobilized. Hemostasis was assured with bipolar electrocautery and the interspace was irrigated with Depo-Medrol and fentanyl. The lumbodorsal fascia was closed with 0 Vicryl sutures the subcutaneous tissues reapproximated 2-0 Vicryl inverted sutures and the skin edges were reapproximated with 3-0 Vicryl subcuticular stitch. The wound is dressed with Dermabond. Patient was extubated in the operating room and taken to recovery in stable and satisfactory condition having tolerated his operation well counts were correct at the end of the case.  PLAN OF CARE: Admit for overnight observation  PATIENT DISPOSITION:  PACU - hemodynamically stable.   Delay start of Pharmacological VTE agent (>24hrs) due to surgical blood loss or risk of bleeding: yes

## 2012-02-18 NOTE — Transfer of Care (Signed)
Immediate Anesthesia Transfer of Care Note  Patient: Kevin Lowe  Procedure(s) Performed: Procedure(s) (LRB) with comments: LUMBAR LAMINECTOMY/DECOMPRESSION MICRODISCECTOMY (Right) - Right Lumbar four-five Microdiskectomy  Patient Location: PACU  Anesthesia Type: General  Level of Consciousness: awake, alert , oriented and patient cooperative  Airway & Oxygen Therapy: Patient Spontanous Breathing and Patient connected to nasal cannula oxygen  Post-op Assessment: Report given to PACU RN, Post -op Vital signs reviewed and stable and Patient moving all extremities X 4  Post vital signs: Reviewed and stable  Complications: No apparent anesthesia complications

## 2012-02-18 NOTE — Preoperative (Signed)
Beta Blockers   Reason not to administer Beta Blockers:Not Applicable 

## 2012-02-18 NOTE — Interval H&P Note (Signed)
History and Physical Interval Note:  02/18/2012 7:26 AM  Kevin Lowe  has presented today for surgery, with the diagnosis of Lumbago, Lumbar hnp without myelopathy  The various methods of treatment have been discussed with the patient and family. After consideration of risks, benefits and other options for treatment, the patient has consented to  Procedure(s) (LRB) with comments: LUMBAR LAMINECTOMY/DECOMPRESSION MICRODISCECTOMY (Right) - Right L4-5 Microdiskectomy as a surgical intervention .  The patient's history has been reviewed, patient examined, no change in status, stable for surgery.  I have reviewed the patient's chart and labs.  Questions were answered to the patient's satisfaction.     Jaymz Traywick D  Date of Initial H&P: 02/18/2012  History reviewed, patient examined, no change in status, stable for surgery.

## 2012-02-18 NOTE — H&P (Signed)
Kathaleen Bury. Cary  #[id number] DOB: [birth year] 01/22/2012:  Mauricio Po comes in today to review his MRI of his lumbar spine which shows a persistent large disc herniation at the L4-5 level eccentric to the right.  He says he is not doing any better and he wants to go ahead with surgery.  We went over this in detail and Georgiann Cocker, RN, my nurse also went over models and specifics of surgical treatment.  He would like to proceed with surgery and we are going to do this as expeditiously as possible. We may not be able to do this until early October.  We will plan on performing a right-sided L4-5 microdiscectomy. I do not think this needs to be done bilaterally.  There has not been significant improvement in the interval MRIs and there still remains significant spinal stenosis, and Mr. Hoos has clearly done everything he can to try to ameliorate the situation without surgical intervention.    I reviewed the studies with the patient and went over his physical examination.  I reviewed surgical models and discussed the typical hospital course and operative and postoperative course and the potential risks and benefits of surgery.  The risks of surgery were discussed in detail and include, but are not limited to, the risks of anesthesia, blood loss and the possibility of hemorrhage, infection, damage to nerves, damage to blood vessels, injury to the lumbar nerve root causing either temporary or permanent leg pain, numbness, weakness.  There is potential for spinal fluid leak from dural tear.  There is the potential for post-laminectomy spondylolisthesis, recurrent disc ruptured quoted at approximately 10%, failure to relieve pain, worsening of pain, need for further surgery.            Danae Orleans. Venetia Maxon, M.D./[facility]   Kathaleen Bury. 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  He currently describes his pain into his legs about 2/10, but up to 7/10 at its worst. He says this has been going on for about a year and has been increasing over he last four months.  He said a year ago he was lifting tile outside and felt a "weird sensation".  He has been using Naprosyn 500 mg. weekly.  He has undergone physical therapy, chiropractic treatment and acupuncture. He is otherwise healthy.  He has been doing Pilates and yoga.  He says he cannot play with his children.  He has not been able to run for the last six months. He is very tired of how much he is hurting, although he says the pain is more of a dull, nagging and unrelenting problem than severe stabbing pain.    REVIEW OF SYSTEMS:   A detailed Review of Systems sheet was reviewed with the patient.  All other systems are negative; this includes Constitutional symptoms, Eyes, Cardiovascular, Ears, nose, mouth, throat, Endocrine, Respiratory, Gastrointestinal, Genitourinary, Musculoskeletal, Integumentary & Breast, Neurologic, Psychiatric, Hematologic/Lymphatic, Allergic/Immunologic.    PAST MEDICAL HISTORY:      Prior Operations and Hospitalizations:   He has had previous varicocele repaired seven to eight years ago, a tonsillectomy at childhood, and nasal surgery in high school.      Medications and Allergies:  He has no known drug allergies and no scheduled medications.  Height and Weight:     He is 5'11" tall, 173 lbs.  BMI is 24.1.    SOCIAL HISTORY:    He denies tobacco or drug use.  He is a social drinker of alcoholic beverages.    DIAGNOSTIC STUDIES:   He had an MRI of his lumbar spine which was performed through [facility] on 23 January 2011 which demonstrates a prominent    central disc protrusion at L4-5 with moderate to severe central canal stenosis and significant lateral recess narrowing bilaterally.    PHYSICAL  EXAMINATION:      General Appearance:   On examination today, Mr. Miano is a pleasant and cooperative man in no acute distress.     Blood Pressure, Pulse:     Blood pressure is 124/84.  Heart rate is 70 and regular.  Respiratory rate is 16.      HEENT - normocephalic, atraumatic.  The pupils are equal, round and reactive to light.  The extraocular muscles are intact.  Sclerae - white.  Conjunctiva - pink.  Oropharynx benign.  Uvula midline.     Neck - there are no masses, meningismus, deformities, tracheal deviation, jugular vein distention or carotid bruits.  There is normal cervical range of motion.  Spurlings' test is negative without reproducible radicular pain turning the patient's head to either side.  Lhermitte's sign is not present with axial compression.      Respiratory - there is normal respiratory effort with good intercostal function.  Lungs are clear to auscultation.  There are no rales, rhonchi or wheezes.      Cardiovascular - the heart has regular rate and rhythm to auscultation.  No murmurs are appreciated.  There is no extremity edema, cyanosis or clubbing.  There are palpable pedal pulses.      Abdomen - soft, nontender, no hepatosplenomegaly appreciated or masses.  There are active bowel sounds.  No guarding or rebound.      Musculoskeletal Examination - he has no significant midline low back pain to palpation.  No significant sciatic notch discomfort.  He is only able to bend to the level of his knees with his upper extremities outstretched. He is able to stand on his heels and toes. He has a positive straight leg raise bilaterally for lower extremity pain at 30 degrees.      NEUROLOGICAL EXAMINATION: The patient is oriented to time, person and place and has good recall of both recent and remote memory with normal attention span and concentration.  The patient speaks with clear and fluent speech and exhibits normal language function and appropriate fund of knowledge.     Kathaleen Bury. Gutridge  DOB:  1971-03-19 #[id number]     February 18, 2011  Page Three     Cranial Nerve Examination - pupils are equal, round and reactive to light.  Extraocular movements are full.  Visual fields are full to confrontational testing.  Facial sensation and facial movement are symmetric and intact.  Hearing is intact to finger rub.  Palate is upgoing.  Shoulder shrug is symmetric.  Tongue protrudes in the midline.      Motor Examination - motor strength is 5/5 in the bilateral deltoids, biceps, triceps, handgrips, wrist extensors, interosseous.  In the lower extremities motor strength is 5/5 in hip flexion, extension, quadriceps, hamstrings, plantar flexion, dorsiflexion and extensor hallucis longus.      Sensory Examination - normal to light touch and pinprick sensation in the upper and lower extremities.  Deep Tendon Reflexes - 2 in the biceps, triceps, and brachioradialis, 2 in the knees, 2 in the ankles.  The great toes are downgoing to plantar stimulation.      Cerebellar Examination - normal coordination in upper and lower extremities and normal rapid alternating movements.  Romberg test is negative.    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 He has a large central disc herniation at L4-5.  He has been battling with this for over a year.  He has significant limitations in ability to function  and is finding himself highly restricted in his activities.  I talked with him at length about the natural history of disc herniations as well as the role for surgery, and conservative management.  I then introduced him to Dr. Ollen Bowl, a pain management specialist, and advised Mr. Missildine to have a lumbar epidural steroid injection to see if he can get some relief of discomfort. Both Dr. Ollen Bowl and I feel that this may well eventually require surgical intervention, but it is certainly worthwhile to pursue  injection therapy and if he can get this under better control then he may not require surgery.    [facility]    Danae Orleans. Venetia Maxon, M.D.

## 2012-02-18 NOTE — Brief Op Note (Signed)
02/18/2012  3:46 PM  PATIENT:  Kevin Lowe  41 y.o. 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, lumbar stenosis, lumbar radiculopathy L 45  PROCEDURE:  Procedure(s) (LRB) with comments: LUMBAR LAMINECTOMY/DECOMPRESSION MICRODISCECTOMY (Right) - Right Lumbar four-five Microdiskectomy  SURGEON:  Surgeon(s) and Role:    * Julien Berryman, MD - Primary    * Ernesto M Botero, MD - Assisting  PHYSICIAN ASSISTANT:   ASSISTANTS: None   ANESTHESIA:   general  EBL:  Total I/O In: 1300 [I.V.:1300] Out: 50 [Blood:50]  BLOOD ADMINISTERED:none  DRAINS: none   LOCAL MEDICATIONS USED:  LIDOCAINE   SPECIMEN:  No Specimen  DISPOSITION OF SPECIMEN:  N/A  COUNTS:  YES  TOURNIQUET:  * No tourniquets in log *  DICTATION: Patient has a large L4-5 disc rupture in the midline extending to the right with significant right leg weakness. It was elected to take him to surgery for right L4-5 microdiscectomy.  Procedure: Patient was brought to the operating room and following the smooth and uncomplicated induction of general endotracheal anesthesia he was placed in a prone position on the Wilson frame. Low back was prepped and draped in the usual sterile fashion with DuraPrep. Area of planned incision was infiltrated with local lidocaine. Incision was made in the midline and carried to the lumbodorsal fascia which was incised on the right side of midline. Subperiosteal dissection was performed exposing what was felt to be L45 level. Intraoperative x-ray demonstrated marker probes at L 5-S1 and L4-5.  A hemi-semi-laminectomy of L4 was performed a high-speed drill and completed with Kerrison rongeurs and a generous foraminotomy was performed overlying the superior aspect of the L5 lamina. Ligamentum flavum was detached and  removed in a piecemeal fashion and the L5 nerve root was decompressed laterally with removal of the superior aspect of the facet and ligamentum causing nerve root compression. The microscope was brought into the field and the L5 nerve root and thecal sac were mobilized medially. This exposed a large amount of soft disc material. Multiple fragments were removed and these extended into the interspace which appeared to be quite soft with a disrupted annulus overlying the interspace. As a result it was elected to further decompress the interspace and remove loose disc material and this was done lightly with a variety Epstein curettes and pituitary rongeurs. The redundant annulus was also removed and shrunk with Bipolar cautery.  At this point it was felt that all neural elements were well decompressed and the thecal sac was significantly decompressedand there was no evidence of residual loose disc material within the interspace. The interspace was then irrigated with bacitracin saline and no additional disc material was mobilized. Hemostasis was assured with bipolar electrocautery and the interspace was irrigated with Depo-Medrol and fentanyl. The lumbodorsal fascia was closed with 0 Vicryl sutures the subcutaneous tissues reapproximated 2-0 Vicryl inverted sutures and the skin edges were reapproximated with 3-0 Vicryl subcuticular stitch. The wound is dressed with Dermabond. Patient was extubated in the operating room and taken to recovery in stable and satisfactory condition having tolerated his operation well counts were correct at the end of the case.  PLAN OF CARE: Admit for overnight observation  PATIENT DISPOSITION:  PACU - hemodynamically stable.   Delay start of Pharmacological VTE agent (>24hrs) due to surgical blood loss or risk of bleeding: yes  

## 2012-02-19 MED ORDER — PANTOPRAZOLE SODIUM 40 MG PO TBEC: 40.0000 mg | DELAYED_RELEASE_TABLET | Freq: Every day | ORAL | Status: DC

## 2012-02-19 NOTE — Discharge Summary (Signed)
Physician Discharge Summary  Patient ID: Kevin Lowe MRN: [id number] DOB/AGE: [birth year] 39 y.o.  Admit date: 02/18/2012 Discharge date: 02/19/2012  Admission Diagnoses: Lumbago, Lumbar herniated nucleus pulposus without myelopathy, lumbar stenosis, lumbar radiculopathy L 45    Discharge Diagnoses: Lumbago, Lumbar herniated nucleus pulposus without myelopathy, lumbar stenosis, lumbar radiculopathy L 45 s/p Right Lumbar four-five Microdiskectomy  Active Problems:  * No active hospital problems. *    Discharged Condition: good  Hospital Course: [name] was admitted 02-18-12 for microdiscectomy for HNP L40-5 with radiculopathy. Following uncomplicated surgery, he recovered nicely in Neuro PACU before transferring to 4N for overnight observation.   Consults: None  Significant Diagnostic Studies: radiology: X-Ray: intra-operative  Treatments: surgery: Right Lumbar four-five Microdiskectomy   Discharge Exam: Blood pressure 124/69, pulse 67, temperature 98.1 F (36.7 C), temperature source Oral, resp. rate 16, height 6' (1.829 m), weight 78.472 kg (173 lb), SpO2 98.00%. Alert, conversant. Incision with Dermabond, no erythema, swelling, drainage. Good strength BLE.       Disposition: D/C IV, d/c to home. Pt verbalized understanding of d/c instructions. Pt will call office for 3-4wk f/u.     Medication List     As of 02/19/2012  8:20 AM    ASK your doctor about these medications         fish oil-omega-3 fatty acids 1000 MG capsule   Take 2 g by mouth daily.      multivitamin with minerals tablet   Take 1 tablet by mouth daily.      SPIRULINA PO   Take 1 tablet by mouth daily.      vitamin C 1000 MG tablet   Take 1,000 mg by mouth daily.         Signed: Georgiann Cocker 02/19/2012, 8:20 AM

## 2012-02-19 NOTE — Progress Notes (Signed)
Subjective: Patient reports "i just hurt in my back, not my leg"  Objective: Vital signs in last 24 hours: Temp:  [97.4 F (36.3 C)-98.1 F (36.7 C)] 98.1 F (36.7 C) (10/09 0544) Pulse Rate:  [59-79] 67  (10/09 0544) Resp:  [7-18] 16  (10/09 0544) BP: (116-139)/(67-91) 124/69 mmHg (10/09 0544) SpO2:  [96 %-100 %] 98 % (10/09 0544) Weight:  [78.472 kg (173 lb)] 78.472 kg (173 lb) (10/08 1720)  Intake/Output from previous day: 10/08 0701 - 10/09 0700 In: 1892.5 [I.V.:1842.5; IV Piggyback:50] Out: 850 [Urine:800; Blood:50] Intake/Output this shift:    Alert, conversant. Incision with Dermabond, no erythema, swelling, drainage. Good strength BLE.   Lab Results: No results found for this basename: WBC:2,HGB:2,HCT:2,PLT:2 in the last 72 hours BMET No results found for this basename: NA:2,K:2,CL:2,CO2:2,GLUCOSE:2,BUN:2,CREATININE:2,CALCIUM:2 in the last 72 hours  Studies/Results: Dg Lumbar Spine 1 View  02/18/2012  *RADIOLOGY REPORT*  Clinical Data: Back pain  LUMBAR SPINE - 1 VIEW  Comparison: 01/22/2012  Findings: Intraoperative lateral film demonstrates probes directed most closely at L4-5 and L5-S1.  IMPRESSION: As above.   Original Report Authenticated By: Elsie Stain, M.D.     Assessment/Plan: Improved  LOS: 1 day  Per Dr. Venetia Maxon, d/c IV, d/c to home. Pt verbalized understanding of d/c instructions. Pt will call office for 3-4wk f/u.    Georgiann Cocker 02/19/2012, 8:16 AM

## 2012-02-19 NOTE — Evaluation (Signed)
Occupational Therapy Evaluation Patient Details Name: Kevin Lowe MRN: [id number] DOB: 06/24/1970 Today's Date: 02/19/2012 Time: 1152-1206 OT Time Calculation (min): 14 min  OT Assessment / Plan / Recommendation Clinical Impression  Pt s/p L4-5 discectomy. Able to perform BADLs and functional mobility at mod I level. All education completed. No further acute OT needs.    OT Assessment  Patient does not need any further OT services    Follow Up Recommendations  No OT follow up;Supervision - Intermittent    Barriers to Discharge      Equipment Recommendations  None recommended by OT    Recommendations for Other Services    Frequency       Precautions / Restrictions Precautions Precautions: Back Precaution Booklet Issued: No Precaution Comments: Pt able to verbalize and maintain back precautions independently   Pertinent Vitals/Pain See Vitals    ADL  Upper Body Bathing: Simulated;Modified independent Where Assessed - Upper Body Bathing: Unsupported sitting Lower Body Bathing: Simulated;Modified independent Where Assessed - Lower Body Bathing: Rolling right and/or left;Supine, head of bed flat Upper Body Dressing: Performed;Modified independent Where Assessed - Upper Body Dressing: Unsupported sitting Lower Body Dressing: Performed;Modified independent Where Assessed - Lower Body Dressing: Supine, head of bed flat;Rolling right and/or left Toilet Transfer: Simulated;Modified independent Toilet Transfer Method: Sit to Barista:  (bed) Toileting - Clothing Manipulation and Hygiene: Simulated;Modified independent Where Assessed - Toileting Clothing Manipulation and Hygiene: Standing Equipment Used:  (none) Transfers/Ambulation Related to ADLs: mod I for ambulation for increased time due to pain ADL Comments: Pt donning pants upon OT arrival.  Pt supine in bed with knees flexed to thread LEs and then rolled L/R to pull pants up over hips.  Educated  that this techniques was appropriate but that he could also sit EOB with ankles crossed over knees.  Pt is fearful of experiencing further pain and declined practicing other technique.    OT Diagnosis:    OT Problem List:   OT Treatment Interventions:     OT Goals    Visit Information  Last OT Received On: 02/19/12 Assistance Needed: +1    Subjective Data      Prior Functioning     Home Living Lives With: Spouse Available Help at Discharge: Family;Available PRN/intermittently Type of Home: House Home Access: Stairs to enter [employer] of Steps: 4 Entrance Stairs-Rails: Right;Left Home Layout: Two level;Bed/bath upstairs Alternate Level Stairs-Number of Steps: 14 Alternate Level Stairs-Rails: Left Bathroom Shower/Tub: Tub/shower unit;Walk-in shower Bathroom Toilet: Standard Home Adaptive Equipment: None Prior Function Level of Independence: Independent Able to Take Stairs?: Reciprically Driving: Yes Communication Communication: No difficulties         Vision/Perception     Cognition  Overall Cognitive Status: Appears within functional limits for tasks assessed/performed Arousal/Alertness: Awake/alert Orientation Level: Oriented X4 / Intact Behavior During Session: [facility] for tasks performed    Extremity/Trunk Assessment Right Upper Extremity Assessment RUE ROM/Strength/Tone: Within functional levels Left Upper Extremity Assessment LUE ROM/Strength/Tone: Within functional levels     Mobility Bed Mobility Bed Mobility: Rolling Right;Rolling Left;Right Sidelying to Sit;Sitting - Scoot to [place] of Bed;Sit to Sidelying Right Rolling Right: 6: Modified independent (Device/Increase time) Rolling Left: 6: Modified independent (Device/Increase time) Right Sidelying to Sit: 6: Modified independent (Device/Increase time);HOB flat Sitting - Scoot to Edge of Bed: 6: Modified independent (Device/Increase time) Sit to Sidelying Right: 6: Modified independent  (Device/Increase time);HOB flat;With rail Details for Bed Mobility Assistance: mod I for increased time Transfers Transfers: Sit to  Stand;Stand to Sit Sit to Stand: 7: Independent;From bed Stand to Sit: 7: Independent;To bed     Shoulder Instructions     Exercise     Balance     End of Session OT - End of Session Equipment Utilized During Treatment:  (none) Activity Tolerance: Patient limited by pain Patient left: in bed;with call bell/phone within reach;with family/visitor present Nurse Communication: Mobility status  GO Functional Assessment Tool Used: clinical judgement Functional Limitation: Self care Self Care Current Status (Z6109): 0 percent impaired, limited or restricted Self Care Goal Status (U0454): 0 percent impaired, limited or restricted Self Care Discharge Status [phone]): 0 percent impaired, limited or restricted  02/19/2012 Cipriano Mile OTR/L Pager [phone] Office [phone]  Cipriano Mile 02/19/2012, 5:01 PM

## 2012-02-19 NOTE — Plan of Care (Signed)
Problem: Consults Goal: Diagnosis - Spinal Surgery Lumbar Laminectomy (Complex)     

## 2012-02-19 NOTE — Discharge Summary (Signed)
As above.

## 2012-02-19 NOTE — Evaluation (Signed)
Physical Therapy Evaluation Patient Details Name: Kevin Lowe MRN: [id number] DOB: 23-Jul-1970 Today's Date: 02/19/2012 Time: 7829-5621 PT Time Calculation (min): 18 min  PT Assessment / Plan / Recommendation Clinical Impression  41 yo adm s/p L4-5 discectomy. Pt mobilized well. Instructed in back precautions as a means to decr pain and protect back during early healing. Discussed activity level at home and using pain as his guide (staying below an 8/10). Provided back handout and pt inquiring re: pictures of ADLs and answered these.  No further PT needed.    PT Assessment  Patent does not need any further PT services    Follow Up Recommendations  No PT follow up    Does the patient have the potential to tolerate intense rehabilitation      Barriers to Discharge        Equipment Recommendations  None recommended by PT    Recommendations for Other Services     Frequency      Precautions / Restrictions Precautions Precautions: Back Precaution Booklet Issued: Yes (comment) Precaution Comments: Instructed pt in back precautions to decr pain; emphasized no forward bending via spine due to discectomy   Pertinent Vitals/Pain 4/10 back; repositioned       Mobility  Bed Mobility Bed Mobility: Rolling Right;Right Sidelying to Sit;Sit to Sidelying Right;Rolling Left Rolling Right: 6: Modified independent (Device/Increase time) Rolling Left: 6: Modified independent (Device/Increase time) Right Sidelying to Sit: 6: Modified independent (Device/Increase time);HOB flat Sit to Sidelying Right: 6: Modified independent (Device/Increase time);HOB flat;With rail Details for Bed Mobility Assistance: Pt reports the recommended technique is what he has done for some time now (PTA) Transfers Transfers: Sit to Stand;Stand to Sit Sit to Stand: 7: Independent Stand to Sit: 7: Independent Ambulation/Gait Ambulation/Gait Assistance: 7: Independent Ambulation Distance (Feet): 100  Feet Assistive device: None Ambulation/Gait Assistance Details: slow yet steady Gait Pattern: Within Functional Limits Stairs: Yes Stairs Assistance: 5: Supervision;7: Independent Stairs Assistance Details (indicate cue type and reason): supervision for safety; progressed to Independent Stair Management Technique: One rail Left;Step to pattern;Sideways;Forwards Number of Stairs: 5     Shoulder Instructions     Exercises     PT Diagnosis:    PT Problem List:   PT Treatment Interventions:     PT Goals    Visit Information  Last PT Received On: 02/19/12 Assistance Needed: +1    Subjective Data  Subjective: Pt reports he has no leg pain now (prior had pain down both legs) Patient Stated Goal: go home and take it easy/heal   Prior Functioning  Home Living Lives With: Spouse Available Help at Discharge: Family;Available PRN/intermittently Type of Home: House Home Access: Stairs to enter [employer] of Steps: 4 Entrance Stairs-Rails: Right;Left Home Layout: Two level;Bed/bath upstairs Alternate Level Stairs-Number of Steps: 14 Alternate Level Stairs-Rails: Left Bathroom Toilet: Standard Home Adaptive Equipment: None Prior Function Level of Independence: Independent Able to Take Stairs?: Reciprically Driving: Yes Communication Communication: No difficulties    Cognition  Overall Cognitive Status: Appears within functional limits for tasks assessed/performed Arousal/Alertness: Awake/alert Orientation Level: Oriented X4 / Intact Behavior During Session: [facility] for tasks performed    Extremity/Trunk Assessment Right Lower Extremity Assessment RLE ROM/Strength/Tone: WFL for tasks assessed RLE Coordination: WFL - gross motor Left Lower Extremity Assessment LLE ROM/Strength/Tone: WFL for tasks assessed LLE Coordination: WFL - gross motor Trunk Assessment Trunk Assessment: Normal   Balance    End of Session PT - End of Session Activity Tolerance: Patient  tolerated treatment well  Patient left: in bed;with call bell/phone within reach;with family/visitor present Nurse Communication: Mobility status;Other (comment) (no DME needs; OK to d/c from PT standpoint)  GP Functional Assessment Tool Used: clinical judgement Functional Limitation: Mobility: Walking and moving around Mobility: Walking and Moving Around Current Status (W0981): At least 1 percent but less than 20 percent impaired, limited or restricted Mobility: Walking and Moving Around Goal Status [phone]): 0 percent impaired, limited or restricted Mobility: Walking and Moving Around Discharge Status [phone]): At least 1 percent but less than 20 percent impaired, limited or restricted   Jennier Schissler 02/19/2012, 11:34 AM  Pager [phone]

## 2012-02-19 NOTE — Clinical Social Work Note (Signed)
Clinical Social Work  CSW received a consult for SNF. CSW reviewed chart and discussed pt in progression with RN. Pt will discharge home today. No further needs identified. CSW is signing off.   Dede Query, MSW, Theresia Majors [phone]

## 2012-02-19 NOTE — Progress Notes (Signed)
Doing well  DC home

## 2012-02-19 NOTE — Progress Notes (Signed)
CARE MANAGEMENT NOTE 02/19/2012  Patient:  Kevin Lowe, Kevin Lowe   Account Number:  [id number]  Date Initiated:  02/19/2012  Documentation initiated by:  Vance Peper  Subjective/Objective Assessment:   41 yr old male s/p L4-5 right lumbar microdiskectomy     Action/Plan:   Progression of care and discharge planning.  No home health needs identified.   Anticipated DC Date:  02/19/2012   Anticipated DC Plan:  HOME/SELF CARE         Choice offered to / List presented to:             Status of service:  Completed, signed off Medicare Important Message given?   (If response is "NO", the following Medicare IM given date fields will be blank) Date Medicare IM given:   Date Additional Medicare IM given:    Discharge Disposition:  HOME/SELF CARE  Per UR Regulation:    If discussed at Long Length of Stay Meetings, dates discussed:    Comments:

## 2012-02-19 NOTE — Progress Notes (Signed)
Pt dc instructions provided by MD. Pt had no questions for RN. Rx also given by MD. Pt iv dc  By RN, intact. Pt under no s/s distress. Pt dc instructions not printed off due to AVS not being complete. MD made aware. Verbal order to just dc patient.

## 2012-02-21 ENCOUNTER — Encounter (HOSPITAL_COMMUNITY): Payer: Self-pay | Admitting: Neurosurgery

## 2012-04-20 ENCOUNTER — Other Ambulatory Visit: Payer: BC Managed Care – PPO

## 2012-08-31 IMAGING — CR DG LUMBAR SPINE COMPLETE 4+V
5 series · 5 of 5 positions shown · non-contrast
Comparison: None.

CLINICAL DATA: Low back pain

LUMBAR SPINE - COMPLETE 4+ VIEW

[view not recorded (1 of 5)]
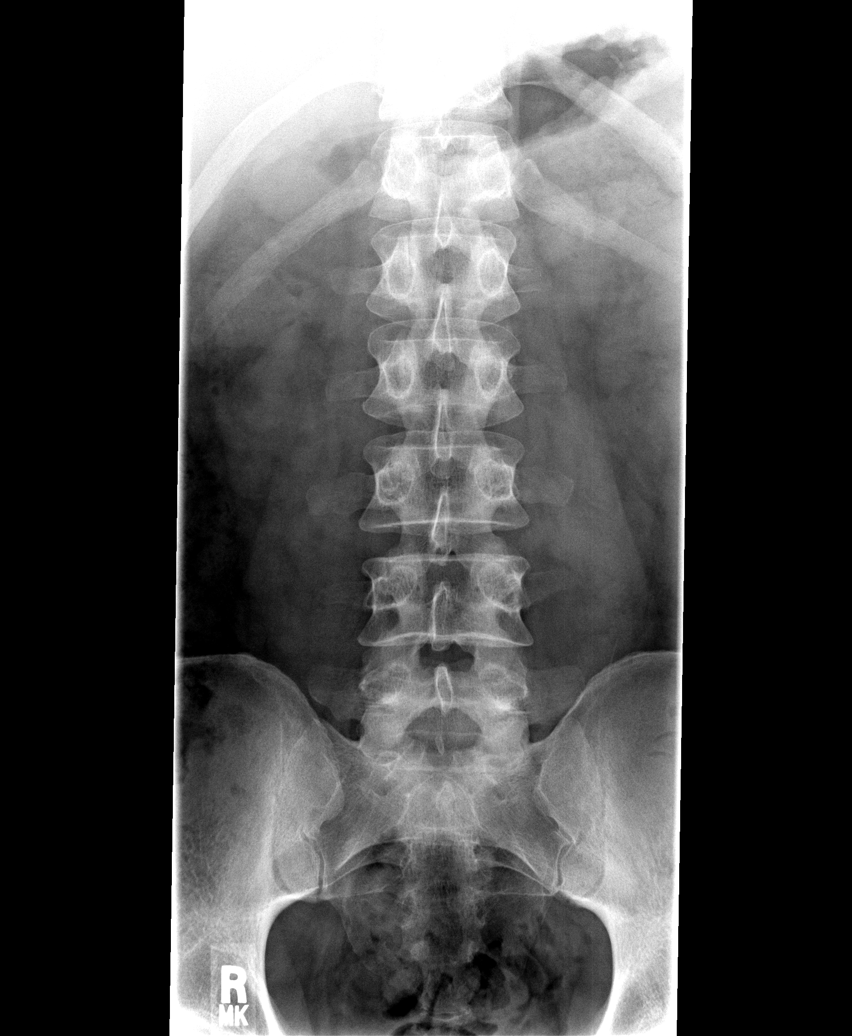

[view not recorded (2 of 5)]
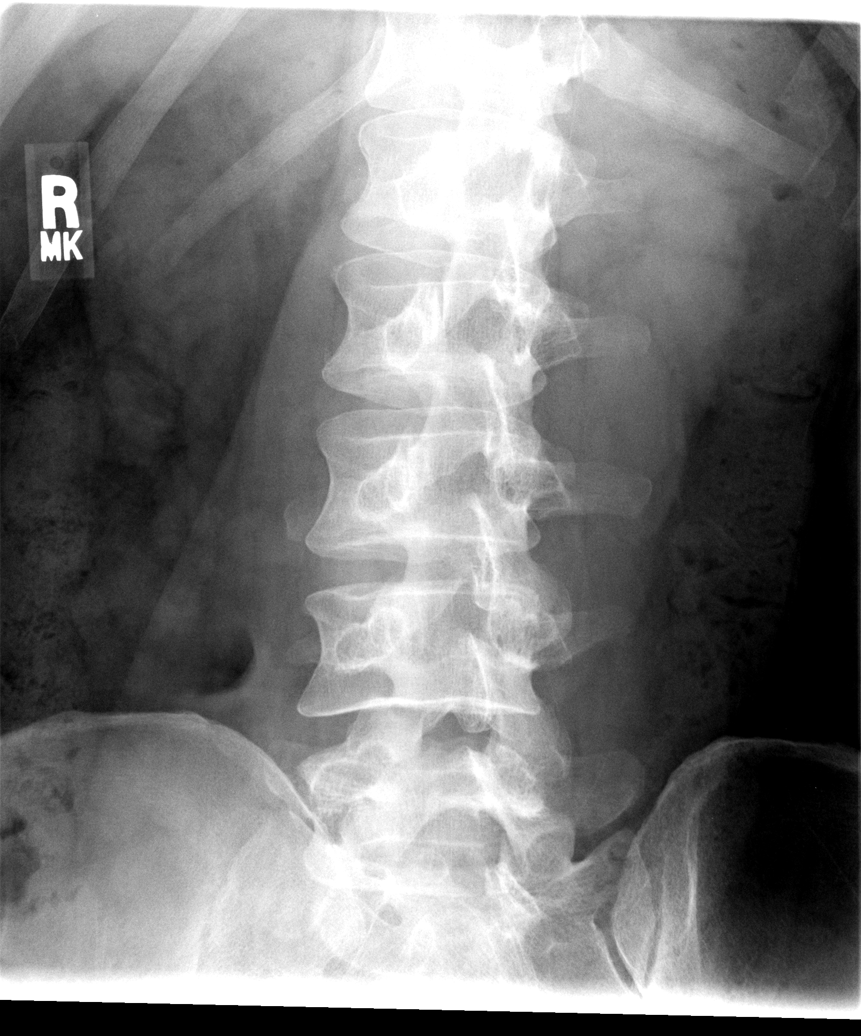

[view not recorded (3 of 5)]
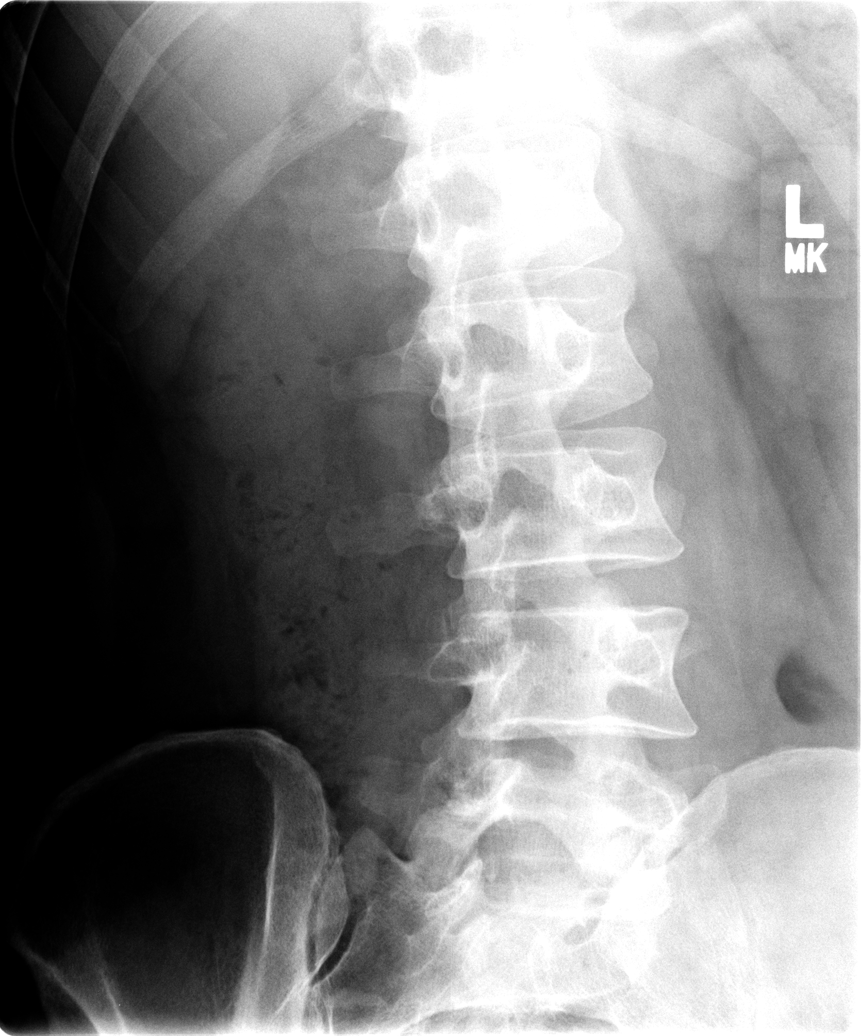

[view not recorded (4 of 5)]
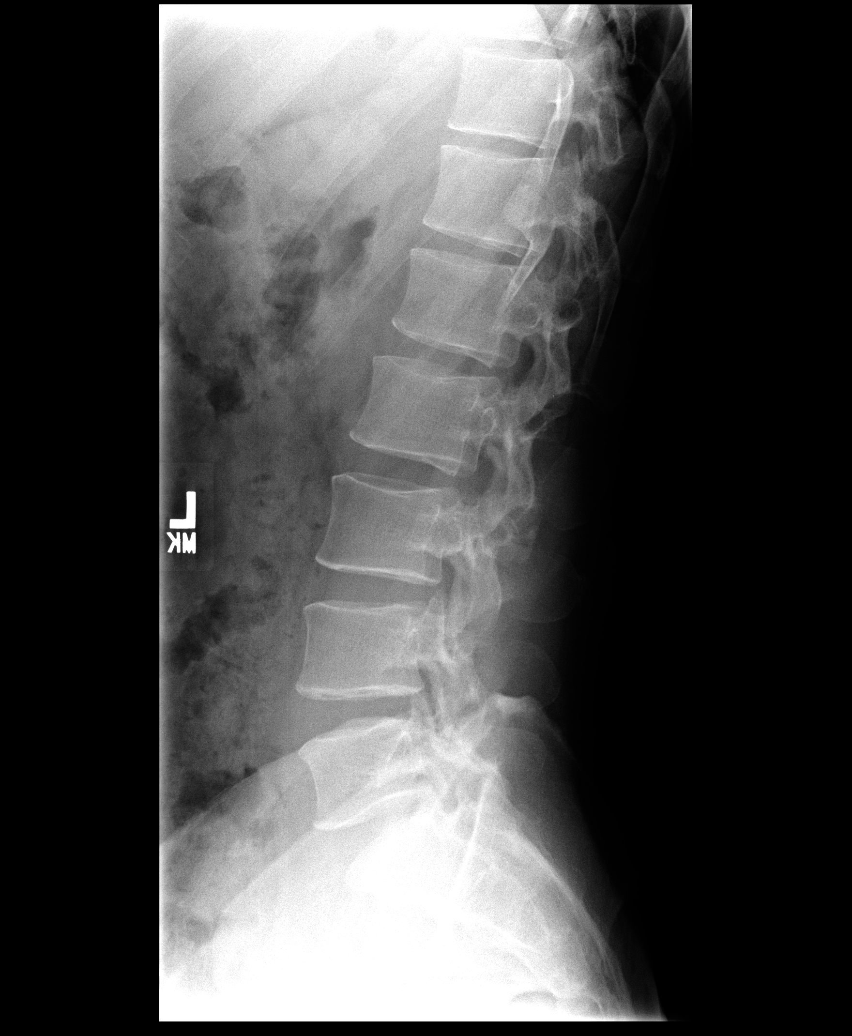

[view not recorded (5 of 5)]
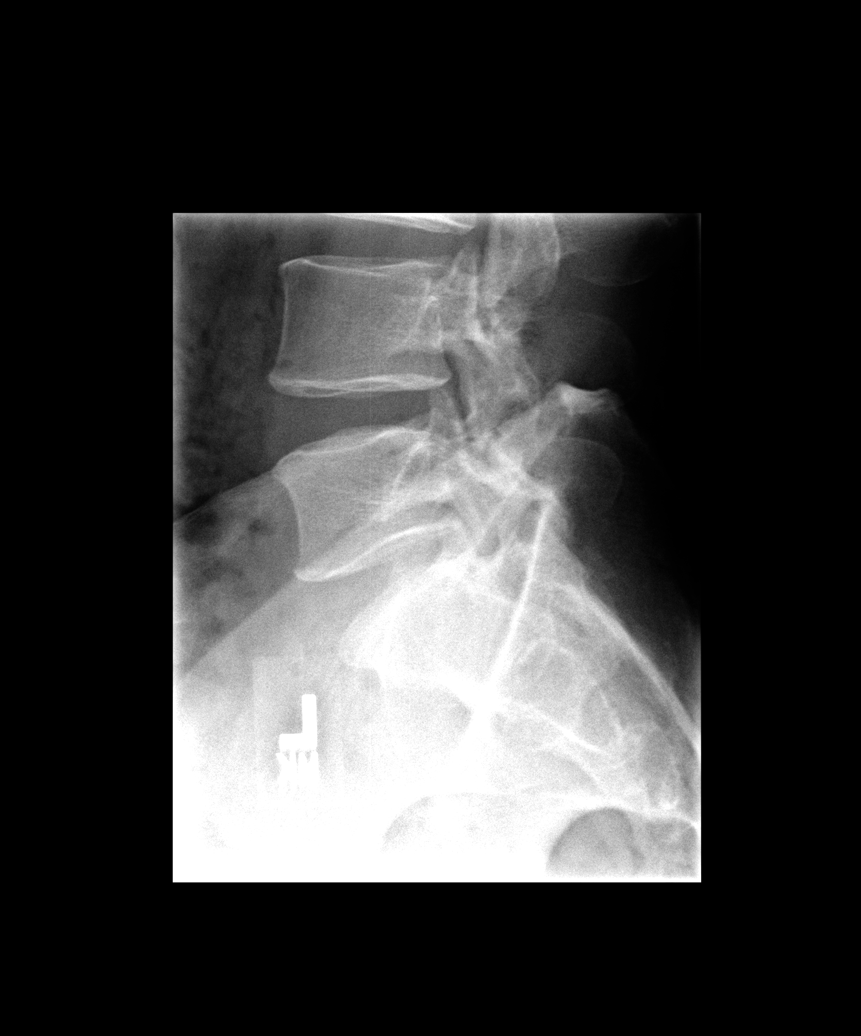

[5 of 5 positions shown; findings below may reference images not displayed]

FINDINGS: There are five non-rib bearing lumbar- type vertebrae.
No fracture or subluxation.  Disc height preserved.  SI joints
normal. Soft tissues unremarkable.
IMPRESSION: No acute or significant findings.

## 2013-08-19 ENCOUNTER — Encounter: Payer: Self-pay | Admitting: Family Medicine

## 2013-09-01 ENCOUNTER — Encounter: Payer: Self-pay | Admitting: Family Medicine

## 2013-09-01 ENCOUNTER — Ambulatory Visit (INDEPENDENT_AMBULATORY_CARE_PROVIDER_SITE_OTHER): Payer: BC Managed Care – PPO | Admitting: Family Medicine

## 2013-09-01 VITALS — BP 110/70 | HR 84 | Ht 71.0 in | Wt 184.0 lb

## 2013-09-01 DIAGNOSIS — M542 Cervicalgia: Secondary | ICD-10-CM

## 2013-09-01 DIAGNOSIS — M62838 Other muscle spasm: Secondary | ICD-10-CM

## 2013-09-01 MED ORDER — KETOROLAC TROMETHAMINE 60 MG/2ML IM SOLN
60.0000 mg | Freq: Once | INTRAMUSCULAR | Status: AC
  Administered 2013-09-01: 60 mg via INTRAMUSCULAR

## 2013-09-01 MED ORDER — NAPROXEN 500 MG PO TABS: 500.0000 mg | ORAL_TABLET | Freq: Two times a day (BID) | ORAL | Status: DC

## 2013-09-01 NOTE — Progress Notes (Signed)
Chief Complaint  Patient presents with  . Shoulder Pain    about 9 months ago he woke with a stiff neck, went away. Then this Saturday he was working out pretty hard and started having the neck and left shoudler pain again. Did try heat and ice and felt better and felt better last night-but woke up today and can hardlty move as he is in so much pain.     L upper back/shoulder pain started 2 weeks ago, after working out.  In the past he had similar problem, it resolved after two weeks.  He had been doing better, but this morning feels worse.  It usually gets better with change in sleep position.  It hurts to move his head side to side, looking down--that triggers increased pain.  He has tried aspirin--helps some.  He has been taking 2 aspirin twice daily for the last 10 days.  Denies radiation in the LUE, no numbness, tingling or weakness in arm.  He also has a slight discomfort on the right side, posteriorly, behind the right year.  Denies headaches  Past Medical History  Diagnosis Date  . Low back pain   . Allergic rhinitis, cause unspecified   . Hyperlipidemia   . Genital warts   . Ruptured spleen     and pancreas related to soccer injury 1992   Past Surgical History  Procedure Laterality Date  . Varicocelectomy  2005    L side  . Cystoscopy  2007    normal--for urinary frequency  . Tonsillectomy  child  . Lumbar laminectomy/decompression microdiscectomy  02/2012    L4-5, Dr. Vertell Limber  . Lumbar laminectomy/decompression microdiscectomy  02/18/2012    Procedure: LUMBAR LAMINECTOMY/DECOMPRESSION MICRODISCECTOMY;  Surgeon: Erline Levine, MD;  Location: [place] [facility];  Service: Neurosurgery;  Laterality: Right;  Right Lumbar four-five Microdiskectomy   History   Social History  . Marital Status: Married    Spouse Name: Lattie Haw    Number of Children: 2  . Years of Education: N/A   Occupational History  . financial advisor    Social History Main Topics  . Smoking status: Former Smoker     Quit date: 05/13/2000  . Smokeless tobacco: Never Used  . Alcohol Use: Yes     Comment: 12 pack per week (6 pack/day on weekends)  . Drug Use: No  . Sexual Activity: Not on file   Other Topics Concern  . Not on file   Social History Narrative  . No narrative on file   Outpatient Encounter Prescriptions as of 09/01/2013  Medication Sig Note  . fish oil-omega-3 fatty acids 1000 MG capsule Take 2 g by mouth daily.   02/04/2012: Held for procedure  . Multiple Vitamins-Minerals (MULTIVITAMIN WITH MINERALS) tablet Take 1 tablet by mouth daily.     Marland Kitchen SPIRULINA PO Take 1 tablet by mouth daily.   . naproxen (NAPROSYN) 500 MG tablet Take 1 tablet (500 mg total) by mouth 2 (two) times daily with a meal.   . [DISCONTINUED] Ascorbic Acid (VITAMIN C) 1000 MG tablet Take 1,000 mg by mouth daily.   02/04/2012: Held for procedure  . [EXPIRED] ketorolac (TORADOL) injection 60 mg     No Known Allergies  ROS: denies fevers, chills, numbness, tingling, weakness, URI symptoms, chest pain, shortness of breath, GI complaints.  No further LBP since surgery. No bleeding/bruising, rashes or other complaints.  PHYSICAL EXAM: BP 110/70  Pulse 84  Ht 5\' 11"  (1.803 m)  Wt 184 lb (83.462 kg)  BMI 25.67 kg/m2 Well developed, pleasant male, in no acute distress He has decreased ROM of neck due to discomfort, especially with forward flexion and left ear to shoulder.  Spine is nontender.  He is tender at left trapezius (in upper shoulder, inferior neck area).  Palpation on upper portion of trapezius in neck caused pain down into the main muscle No lymphadenopathy or mass Neuro: normal strength, sensation, DTR's  ASSESSMENT/PLAN:  Neck pain on left side - Plan: naproxen (NAPROSYN) 500 MG tablet, ketorolac (TORADOL) injection 60 mg  Muscle spasms of neck - Plan: ketorolac (TORADOL) injection 60 mg  toradol 60mg  IM today Naproxen to start after 6 hours, to be used regularly until pain improved  NSAID  precautions reviewed. Stop aspirin Declines muscle relaxants (reviewed in detail). Taught stretches. Heat, massage BID-TID recommended Call in 1-2 weeks for referral to PT if not improving. Return for f/u visit if radicular symptoms develop

## 2013-09-01 NOTE — Patient Instructions (Signed)
Wait 6 hours after the injection before starting anti-inflammatories. Take the naprosyn with food. Do not take aspirin or any other over-the-counter anti-inflammatories (only tylenol is okay).  Do the stretches twice daily as shown, along with heat and massage. Try Thermacare heat compresses during the day, if needed  Call in 1-2 weeks for referral to PT if not improving. Return for re-evaluation if worsening symptoms, especially with any numbness/tingling/radiation of pain into the arm

## 2013-09-24 ENCOUNTER — Telehealth: Payer: Self-pay | Admitting: Family Medicine

## 2013-09-24 MED ORDER — METHOCARBAMOL 500 MG PO TABS: 500.0000 mg | ORAL_TABLET | Freq: Four times a day (QID) | ORAL | Status: DC | PRN

## 2013-09-24 NOTE — Telephone Encounter (Signed)
Left message for pt to call me back 

## 2013-09-24 NOTE — Telephone Encounter (Signed)
Pt states he would like a muscle relaxer and be referred to PT. He wants to be sent to the place where you referred him to for his back pain and pt states that the PT name is long but he can not remember the name. Pt states his pain is random, like he can turn his head to the right and it shoots pain down his neck to his rotator cuff and then he can look down and pain will start on his upper shoulder and neck and then he will fine. Please call in med to [facility].

## 2013-09-24 NOTE — Telephone Encounter (Signed)
He had declined muscle relaxants at his visit 4/22--would he like to try now?  As we discussed at his visit: "Call in 1-2 weeks for referral to PT if not improving.  Return for f/u visit if radicular symptoms develop" (it has been 3 weeks)  If he isn't having any pain, numbness/tingling/weakness of the arm, then we can refer to PT as we previously discussed.  Let me know if he has a preference of PT to be referred to (otherwise it will be [facility])

## 2013-09-24 NOTE — Telephone Encounter (Signed)
Sent in robaxin to [facility] and pt did not need a refill on naproxen at this time. I have referred pt over to [facility]. Pt is aware of appt next Thursday may 21 @ 2:00pm

## 2013-09-24 NOTE — Telephone Encounter (Signed)
[facility] meds went (that must be what he is referring to).  Okay to refill his naproxen also--please do. We had a long discussion about muscle relaxants at visit--he needs to let me know if he would like one that he can use during the day (Robaxin 500mg , 1-2 q6 hrs prn muscle spasm #30; may cause sedation, but much less likely than other med), or one that will more than likely make him drowsy--can be used TID, but may only be able to take at bedtime.  That kind is good if his neck pain interferes with his sleep.  That rx would be flexeril 10mg  TID prn muscle spasm #20 (most likely to only be used at night). No refills on either. The naproxen can be for another #30, no refill. Thanks for clarifying, and sending those in.  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  He is with [facility].  Okay to refer there.  See if that sounds familiar to him, as I don't see anything in the computer

## 2013-10-06 ENCOUNTER — Encounter: Payer: Self-pay | Admitting: Family Medicine

## 2013-10-06 ENCOUNTER — Ambulatory Visit (INDEPENDENT_AMBULATORY_CARE_PROVIDER_SITE_OTHER): Payer: BC Managed Care – PPO | Admitting: Family Medicine

## 2013-10-06 VITALS — BP 118/70 | HR 64 | Ht 71.0 in | Wt 171.0 lb

## 2013-10-06 DIAGNOSIS — R5381 Other malaise: Secondary | ICD-10-CM

## 2013-10-06 DIAGNOSIS — E7800 Pure hypercholesterolemia, unspecified: Secondary | ICD-10-CM

## 2013-10-06 DIAGNOSIS — Z0000 Encounter for general adult medical examination without abnormal findings: Secondary | ICD-10-CM

## 2013-10-06 DIAGNOSIS — M542 Cervicalgia: Secondary | ICD-10-CM

## 2013-10-06 DIAGNOSIS — Z125 Encounter for screening for malignant neoplasm of prostate: Secondary | ICD-10-CM

## 2013-10-06 DIAGNOSIS — R5383 Other fatigue: Secondary | ICD-10-CM

## 2013-10-06 DIAGNOSIS — Z8042 Family history of malignant neoplasm of prostate: Secondary | ICD-10-CM

## 2013-10-06 LAB — CBC WITH DIFFERENTIAL/PLATELET
BASOS ABS: 0 10*3/uL (ref 0.0–0.1)
BASOS PCT: 0 % (ref 0–1)
EOS ABS: 0.1 10*3/uL (ref 0.0–0.7)
Eosinophils Relative: 2 % (ref 0–5)
HCT: 46.9 % (ref 39.0–52.0)
Hemoglobin: 16.7 g/dL (ref 13.0–17.0)
Lymphocytes Relative: 36 % (ref 12–46)
Lymphs Abs: 1.6 10*3/uL (ref 0.7–4.0)
MCH: 30.9 pg (ref 26.0–34.0)
MCHC: 35.6 g/dL (ref 30.0–36.0)
MCV: 86.7 fL (ref 78.0–100.0)
Monocytes Absolute: 0.4 10*3/uL (ref 0.1–1.0)
Monocytes Relative: 9 % (ref 3–12)
NEUTROS PCT: 53 % (ref 43–77)
Neutro Abs: 2.3 10*3/uL (ref 1.7–7.7)
PLATELETS: 221 10*3/uL (ref 150–400)
RBC: 5.41 MIL/uL (ref 4.22–5.81)
RDW: 13.2 % (ref 11.5–15.5)
WBC: 4.4 10*3/uL (ref 4.0–10.5)

## 2013-10-06 LAB — POCT URINALYSIS DIPSTICK
Bilirubin, UA: NEGATIVE
Blood, UA: NEGATIVE
Glucose, UA: NEGATIVE
KETONES UA: NEGATIVE
LEUKOCYTES UA: NEGATIVE
Nitrite, UA: NEGATIVE
PH UA: 8
UROBILINOGEN UA: NEGATIVE

## 2013-10-06 LAB — LIPID PANEL
CHOL/HDL RATIO: 4.1 ratio
Cholesterol: 205 mg/dL — ABNORMAL HIGH (ref 0–200)
HDL: 50 mg/dL (ref >39)
LDL Cholesterol: 132 mg/dL — ABNORMAL HIGH (ref 0–99)
Triglycerides: 115 mg/dL (ref <150)
VLDL: 23 mg/dL (ref 0–40)

## 2013-10-06 LAB — COMPREHENSIVE METABOLIC PANEL
ALBUMIN: 4.4 g/dL (ref 3.5–5.2)
ALK PHOS: 48 U/L (ref 39–117)
ALT: 28 U/L (ref 0–53)
AST: 22 U/L (ref 0–37)
BUN: 13 mg/dL (ref 6–23)
CHLORIDE: 103 meq/L (ref 96–112)
CO2: 29 mEq/L (ref 19–32)
Calcium: 9.6 mg/dL (ref 8.4–10.5)
Creat: 0.92 mg/dL (ref 0.50–1.35)
Glucose, Bld: 92 mg/dL (ref 70–99)
POTASSIUM: 4.5 meq/L (ref 3.5–5.3)
SODIUM: 140 meq/L (ref 135–145)
TOTAL PROTEIN: 7 g/dL (ref 6.0–8.3)
Total Bilirubin: 0.8 mg/dL (ref 0.2–1.2)

## 2013-10-06 LAB — TSH: TSH: 1.142 u[IU]/mL (ref 0.350–4.500)

## 2013-10-06 NOTE — Patient Instructions (Signed)

## 2013-10-06 NOTE — Progress Notes (Signed)
Chief Complaint  Patient presents with  . Annual Exam    fasting annual exam. UA showed trace protein, does state that he does have some frequency(has seen urologist in the past). Has done one session of PT for his neck, if you have time he would like to disucss briefly today.  Unsure if he is having prostate exam(has family history of prostate ca)   Kevin Lowe is a 43 y.o. male who presents for a complete physical.  He has the following concerns:  Ongoing L sided neck pain.  Denies any numbness/tingling/weakness in the left arm, but "almost feels like it wants to" go down the arm.  Very slight vertigo if he moves his head quickly.  He is very worried about his neck becoming a chronic issue, like his back was,and that he will ultimately require surgery. He didn't take much of the robaxin as it made him a little sleepy. He has only had one PT session so far. His pain had improved, but he had some recurrence over the weekend which has made him nervous about it becoming chronic.  Hyperlipidemia:  On a lowfat, low cholesterol diet. He eats healthy, and exercises regularly.  Immunization History  Administered Date(s) Administered  . Influenza Split 02/19/2012  . Influenza Whole 01/16/2011  . Tdap 01/25/2011  didn't get flu shot last year Last colonoscopy: never  Last PSA: N/A  Dentist: twice yearly  Ophtho: never  Exercise: 3-5x/week high intensity interval training; nothing since [place] race 6 weeks ago where his shoulder started bothering him. Shoulder bothered him while running (was training for 1/2 marathon).  Past Medical History  Diagnosis Date  . Low back pain     resolved s/p surgery  . Allergic rhinitis, cause unspecified   . Hyperlipidemia   . Genital warts   . Ruptured spleen     and pancreas related to soccer injury 1992    Past Surgical History  Procedure Laterality Date  . Varicocelectomy  2005    L side  . Cystoscopy  2007    normal--for urinary frequency  .  Tonsillectomy  child  . Lumbar laminectomy/decompression microdiscectomy  02/2012    L4-5, Dr. Vertell Limber  . Lumbar laminectomy/decompression microdiscectomy  02/18/2012    Procedure: LUMBAR LAMINECTOMY/DECOMPRESSION MICRODISCECTOMY;  Surgeon: Erline Levine, MD;  Location: [place] [facility];  Service: Neurosurgery;  Laterality: Right;  Right Lumbar four-five Microdiskectomy    History   Social History  . Marital Status: Married    Spouse Name: Lattie Haw    Number of Children: 2  . Years of Education: N/A   Occupational History  . financial advisor    Social History Main Topics  . Smoking status: Former Smoker    Quit date: 05/13/2000  . Smokeless tobacco: Never Used  . Alcohol Use: Yes     Comment: 12 pack per week (6 pack/day on weekends)  . Drug Use: No  . Sexual Activity: Yes    Partners: Female   Other Topics Concern  . Not on file   Social History Narrative   Married, 2 children. No pets    Family History  Problem Relation Age of Onset  . Cancer Mother     breast--mid 55's  . Heart disease Father     CABG in early 5's; pacemaker  . Depression Father   . Cancer Father 79    prostate cancer  . Heart disease Paternal Uncle     67's (died in 51's)  . Alzheimer's disease Maternal  Grandmother   . Heart disease Paternal Grandfather     died in mid-40's from heart disease  . Diabetes Neg Hx    Outpatient Encounter Prescriptions as of 10/06/2013  Medication Sig Note  . fish oil-omega-3 fatty acids 1000 MG capsule Take 2 g by mouth daily.   10/06/2013: Takes daily  . Multiple Vitamins-Minerals (MULTIVITAMIN WITH MINERALS) tablet Take 1 tablet by mouth daily.     Marland Kitchen SPIRULINA PO Take 1 tablet by mouth daily.   . methocarbamol (ROBAXIN) 500 MG tablet Take 1 tablet (500 mg total) by mouth every 6 (six) hours as needed for muscle spasms. 10/06/2013: Not taking--caused some sedation  . [DISCONTINUED] naproxen (NAPROSYN) 500 MG tablet Take 1 tablet (500 mg total) by mouth 2 (two) times  daily with a meal.    No Known Allergies  ROS: The patient denies anorexia, fever, headaches, vision loss, decreased hearing, ear pain, hoarseness, chest pain, palpitations, dizziness (rare, short-lived vertigo with fast head movements only), syncope, dyspnea on exertion, cough, swelling, nausea, vomiting, diarrhea, constipation, abdominal pain, melena, hematochezia, indigestion/heartburn, hematuria, incontinence, erectile dysfunction, nocturia, weakened urine stream, dysuria, genital lesions, numbness, tingling, weakness, tremor, suspicious skin lesions, depression, abnormal bleeding/bruising, or enlarged lymph nodes L neck, right shoulder pain Some urinary frequency, chronic and unchanged. Up twice/night to void.  Drinks a lot of water.  PHYSICAL EXAM:  BP 118/70  Pulse 64  Ht 5\' 11"  (1.803 m)  Wt 171 lb (77.565 kg)  BMI 23.86 kg/m2  General Appearance:  Alert, cooperative, no distress, appears stated age   Head:  Normocephalic, without obvious abnormality, atraumatic. Head is shaven  Eyes:  PERRL, conjunctiva/corneas clear, EOM's intact, fundi  benign   Ears:  Normal TM's and external ear canals   Nose:  Nares normal, mucosa normal, no drainage or sinus tenderness   Throat:  Lips, mucosa, and tongue normal; teeth and gums normal. Braces.   Neck:  Supple, no lymphadenopathy; thyroid: no enlargement/tenderness/nodules; no carotid  bruit or JVD   Back:  Spine nontender, no curvature, ROM normal, no CVA Tenderness.  There is no c-spine tenderness or muscle spasm.  Neck is nontender  Lungs:  Clear to auscultation bilaterally without wheezes, rales or ronchi; respirations unlabored   Chest Wall:  No tenderness or deformity   Heart:  Regular rate and rhythm, S1 and S2 normal, no murmur, rub  or gallop   Breast Exam:  No chest wall tenderness, masses or gynecomastia   Abdomen:  Soft, non-tender, nondistended, normoactive bowel sounds,  no masses, no hepatosplenomegaly   Genitalia:  Normal  male external genitalia without lesions. Testicles without masses. No inguinal hernias.   Rectal:  Normal sphincter tone.  Prostate is smooth, normal size, no nodules.  No stool in vault to heme-test  Extremities:  No clubbing, cyanosis or edema   Pulses:  2+ and symmetric all extremities   Skin:  Skin color, texture, turgor normal, no rashes or lesions   Lymph nodes:  Cervical, supraclavicular, and axillary nodes normal   Neurologic:  CNII-XII intact, normal strength, sensation and gait; reflexes 2+ and symmetric throughout. Negative straight leg raise (some discomfort in low back, not in legs)          Psych: Normal mood, affect, hygiene and grooming.   ASSESSMENT/PLAN:  Routine general medical examination at a health care facility - Plan: Visual acuity screening, POCT Urinalysis Dipstick, Lipid panel, Comprehensive metabolic panel, CBC with Differential, TSH, PSA  Pure hypercholesterolemia - continue low cholesterol diet.  Goals reviewed (LDL<130, ratio <4.0) - Plan: Lipid panel  Special screening for malignant neoplasm of prostate  Family history of prostate cancer - Plan: PSA  Other malaise and fatigue - Plan: Comprehensive metabolic panel, CBC with Differential, TSH  Neck pain - continue with PT. prn muscle relaxant, NSAID. reviewed potential therapeutic modalities that might help him (only had 1 PT session)--TENS, traction, etc - Plan: CBC with Differential   Recommended at least 30 minutes of aerobic activity at least 5 days/week; proper sunscreen use reviewed; PSA risks and benefits were reviewed in detail--checking due to family history; healthy diet and alcohol recommendations (less than or equal to 2 drinks/day) reviewed; regular seatbelt use; changing batteries in smoke detectors. Self-testicular exams. Immunization recommendations discussed--yearly flu shots encouraged. Colonoscopy recommendations reviewed--age 53.

## 2013-10-07 LAB — PSA: PSA: 0.51 ng/mL (ref <=4.00)

## 2015-02-16 ENCOUNTER — Encounter: Payer: Self-pay | Admitting: Family Medicine

## 2015-08-11 ENCOUNTER — Telehealth: Payer: Self-pay | Admitting: Family Medicine

## 2015-08-11 NOTE — Telephone Encounter (Signed)
Rcvd a message from the answering service stating that pt would like a call back to schedule an appt. Called pt and left a message to call us back

## 2016-01-05 ENCOUNTER — Telehealth: Payer: Self-pay | Admitting: Family Medicine

## 2016-01-05 NOTE — Telephone Encounter (Signed)
Not seen here in 2 years--needs OV.  He probably should f/u with his neurosurgeon rather than here, but if he prefers starting here (for PT, poss MRI, etc) that is fine, but needs OV

## 2016-01-05 NOTE — Telephone Encounter (Signed)
Pt requesting referrals for mri of his back & possibly physical therapy because his herniated disc has flared up again. Pt said he had surgery for this several years ago but has flared up again about 3 wks ago.

## 2016-01-05 NOTE — Telephone Encounter (Signed)
Called pt and left message with Dr Johnsie Kindred instructions

## 2016-01-08 ENCOUNTER — Encounter (HOSPITAL_COMMUNITY): Payer: Self-pay | Admitting: *Deleted

## 2016-01-08 ENCOUNTER — Emergency Department (HOSPITAL_COMMUNITY): Payer: BLUE CROSS/BLUE SHIELD

## 2016-01-08 ENCOUNTER — Emergency Department (HOSPITAL_COMMUNITY)
Admission: EM | Admit: 2016-01-08 | Discharge: 2016-01-09 | Disposition: A | Discharge status: Home / Self Care | Payer: BLUE CROSS/BLUE SHIELD | Attending: Emergency Medicine | Admitting: Emergency Medicine

## 2016-01-08 ENCOUNTER — Telehealth: Payer: Self-pay | Admitting: Family Medicine

## 2016-01-08 DIAGNOSIS — X500XXA Overexertion from strenuous movement or load, initial encounter: Secondary | ICD-10-CM | POA: Insufficient documentation

## 2016-01-08 DIAGNOSIS — Y929 Unspecified place or not applicable: Secondary | ICD-10-CM | POA: Diagnosis not present

## 2016-01-08 DIAGNOSIS — Y939 Activity, unspecified: Secondary | ICD-10-CM | POA: Insufficient documentation

## 2016-01-08 DIAGNOSIS — M545 Low back pain: Secondary | ICD-10-CM | POA: Diagnosis present

## 2016-01-08 DIAGNOSIS — M549 Dorsalgia, unspecified: Secondary | ICD-10-CM

## 2016-01-08 DIAGNOSIS — M5417 Radiculopathy, lumbosacral region: Secondary | ICD-10-CM | POA: Diagnosis not present

## 2016-01-08 DIAGNOSIS — Z87891 Personal history of nicotine dependence: Secondary | ICD-10-CM | POA: Insufficient documentation

## 2016-01-08 DIAGNOSIS — Y999 Unspecified external cause status: Secondary | ICD-10-CM | POA: Diagnosis not present

## 2016-01-08 MED ORDER — GADOBENATE DIMEGLUMINE 529 MG/ML IV SOLN
20.0000 mL | Freq: Once | INTRAVENOUS | Status: AC
  Administered 2016-01-08: 17 mL via INTRAVENOUS

## 2016-01-08 NOTE — Telephone Encounter (Signed)
Kevin Lowe called and states he has a herniated disc and cannot get into see neuro until 9/18.  He doesn't want to go 3 weeks in pain.  He wants you to send him in some pain meds.  Pt ph [phone] please advise pt.  He also has an appt with you on 9/11.

## 2016-01-08 NOTE — Telephone Encounter (Signed)
Scheduled for this Wed 01/10/16 @ 2:45pm with Dr.Knapp per his request.

## 2016-01-08 NOTE — ED Provider Notes (Signed)
[facility] DEPT Provider Note   CSN: [id number] Arrival date & time: 01/08/16  2049     History   Chief Complaint Chief Complaint  Patient presents with  . Back Pain    HPI Kevin Lowe is a 45 y.o. male.  HPI Kevin Lowe is a 45 y.o. male presents to emergency department complaining of lower back pain and urinary discomfort. Patient states that approximately a week ago he was lifting something heavy and felt pain in the lower back. Reports pain in the lower back since then. States that 2 days ago she noticed some numbness and tingling in the private area. States he feels pressure and his pelvic floor when he urinates. Reports some pain in his scrotum as well. Reports that his right thigh feels numb. He reports prior discectomy on L 4 L5  Past Medical History:  Diagnosis Date  . Allergic rhinitis, cause unspecified   . Genital warts   . Hyperlipidemia   . Low back pain    resolved s/p surgery  . Ruptured spleen    and pancreas related to soccer injury 1992    Patient Active Problem List   Diagnosis Date Noted  . Neck pain 10/06/2013  . Allergic rhinitis, cause unspecified 01/16/2011    Past Surgical History:  Procedure Laterality Date  . CYSTOSCOPY  2007   normal--for urinary frequency  . LUMBAR LAMINECTOMY/DECOMPRESSION MICRODISCECTOMY  02/2012   L4-5, Dr. Vertell Limber  . LUMBAR LAMINECTOMY/DECOMPRESSION MICRODISCECTOMY  02/18/2012   Procedure: LUMBAR LAMINECTOMY/DECOMPRESSION MICRODISCECTOMY;  Surgeon: Erline Levine, MD;  Location: [facility];  Service: Neurosurgery;  Laterality: Right;  Right Lumbar four-five Microdiskectomy  . TONSILLECTOMY  child  . VARICOCELECTOMY  2005   L side       Home Medications    Prior to Admission medications   Medication Sig Start Date End Date Taking? Authorizing Provider  fish oil-omega-3 fatty acids 1000 MG capsule Take 2 g by mouth daily.      Historical Provider, MD  methocarbamol (ROBAXIN) 500 MG tablet Take 1 tablet  (500 mg total) by mouth every 6 (six) hours as needed for muscle spasms. 09/24/13   Rita Ohara, MD  Multiple Vitamins-Minerals (MULTIVITAMIN WITH MINERALS) tablet Take 1 tablet by mouth daily.      Historical Provider, MD  SPIRULINA PO Take 1 tablet by mouth daily.    Historical Provider, MD    Family History Family History  Problem Relation Age of Onset  . Cancer Mother     breast--mid 42's  . Heart disease Father     CABG in early 64's; pacemaker  . Depression Father   . Cancer Father 18    prostate cancer  . Heart disease Paternal Uncle     64's (died in 64's)  . Alzheimer's disease Maternal Grandmother   . Heart disease Paternal Grandfather     died in mid-40's from heart disease  . Diabetes Neg Hx     Social History Social History  Substance Use Topics  . Smoking status: Former Smoker    Quit date: 05/13/2000  . Smokeless tobacco: Never Used  . Alcohol use Yes     Comment: 12 pack per week (6 pack/day on weekends)     Allergies   Review of patient's allergies indicates no known allergies.   Review of Systems Review of Systems  Constitutional: Negative for chills and fever.  Respiratory: Negative for cough, chest tightness and shortness of breath.   Cardiovascular: Negative for chest pain,  palpitations and leg swelling.  Gastrointestinal: Negative for abdominal distention, abdominal pain, diarrhea, nausea and vomiting.  Genitourinary: Positive for difficulty urinating, dysuria and testicular pain. Negative for frequency, hematuria and urgency.  Musculoskeletal: Positive for back pain and myalgias. Negative for arthralgias, neck pain and neck stiffness.  Skin: Negative for rash.  Allergic/Immunologic: Negative for immunocompromised state.  Neurological: Positive for numbness. Negative for dizziness, weakness, light-headedness and headaches.  All other systems reviewed and are negative.    Physical Exam Updated Vital Signs BP 136/88 (BP Location: Left Arm)    Pulse 71   Temp 98.4 F (36.9 C) (Oral)   Resp 16   Ht 6' (1.829 m)   Wt 82.4 kg   SpO2 99%   BMI 24.62 kg/m   Physical Exam  Constitutional: He appears well-developed and well-nourished. No distress.  HENT:  Head: Normocephalic and atraumatic.  Eyes: Conjunctivae are normal.  Neck: Neck supple.  Cardiovascular: Normal rate, regular rhythm and normal heart sounds.   Pulmonary/Chest: Effort normal. No respiratory distress. He has no wheezes. He has no rales.  Abdominal: Soft. Bowel sounds are normal. He exhibits no distension. There is no tenderness. There is no rebound.  Genitourinary: Penis normal.  Genitourinary Comments: Normal bilateral scrotum with no tenderness. Sensation over perineum intact  Musculoskeletal: He exhibits no edema.  Full ROM of bilateral LE extremities. No midline lower spine tenderness  Neurological: He is alert.  5/5 and equal lower extremity strength. 2+ and equal patellar reflexes bilaterally. Pt able to dorsiflex bilateral toes and feet with good strength against resistance. Equal sensation bilaterally over thighs and lower legs.   Skin: Skin is warm and dry. Capillary refill takes less than 2 seconds.  Nursing note and vitals reviewed.    ED Treatments / Results  Labs (all labs ordered are listed, but only abnormal results are displayed) Labs Reviewed  URINALYSIS W MICROSCOPIC (NOT AT [facility])    EKG  EKG Interpretation None       Radiology Mr Lumbar Spine W Wo Contrast  Result Date: 01/08/2016 CLINICAL DATA:  Low back pain.  Right leg numbness. EXAM: MRI LUMBAR SPINE WITHOUT AND WITH CONTRAST TECHNIQUE: Multiplanar and multiecho pulse sequences of the lumbar spine were obtained without and with intravenous contrast. CONTRAST:  64mL MULTIHANCE GADOBENATE DIMEGLUMINE 529 MG/ML IV SOLN COMPARISON:  Lumbar spine radiograph 02/18/2012 FINDINGS: Segmentation:  Normal Alignment:  Normal Vertebrae: No acute compression fracture, facet edema or focal  marrow lesion. There are postsurgical changes of prior right laminectomy at L5. Conus medullaris: Extends to the L2 level and appears normal. Paraspinal and other soft tissues: The visualized aorta, IVC and iliac vessels are normal. The visualized retroperitoneal organs and paraspinal soft tissues are normal. Disc levels: T12-L1: Normal disc space and facet joints. No spinal canal stenosis. No neuroforaminal stenosis. L1-L2: Normal disc space and facet joints. No spinal canal stenosis. No neuroforaminal stenosis. L2-L3: Mild facet hypertrophy. Normal disc space. No spinal canal stenosis. No neuroforaminal stenosis. L3-L4: Mild facet hypertrophy. Normal disc space. No spinal canal stenosis. No neuroforaminal stenosis. L4-L5: There is disc desiccation with a moderate diffuse bulge and small right central annular fissure. There is moderate facet hypertrophy. There are findings of right laminectomy at this level. No spinal canal stenosis. Mild-to-moderate right and mild left neuroforaminal stenosis. L5-S1: Small central disc protrusion. No spinal canal stenosis. No neuroforaminal stenosis. IMPRESSION: 1. No spinal canal stenosis or cauda equina compression. 2. Mild-to-moderate right and mild left neural foraminal narrowing at the L4-5  level secondary to disc bulge and facet hypertrophy. Electronically Signed   By: Ulyses Jarred M.D.   On: 01/08/2016 22:56    Procedures Procedures (including critical care time)  Medications Ordered in ED Medications  gadobenate dimeglumine (MULTIHANCE) injection 20 mL (17 mLs Intravenous Contrast Given 01/08/16 2242)     Initial Impression / Assessment and Plan / ED Course  I have reviewed the triage vital signs and the nursing notes.  Pertinent labs & imaging results that were available during my care of the patient were reviewed by me and considered in my medical decision making (see chart for details).  Clinical Course  Comment By Time  Patient initially presented to  the ER with urinary discomfort, pain to the pelvic floor and scrotum, lower back pain. Numbness to the scrotum. History of laminectomy. MRI was ordered x-ray-rule out cauda equina. MRI with no significant findings to explain his symptoms. I performed physical exam on his private area, no findings. Sensation is intact. His reflexes are intact. Strength is intact. We'll check UA. Jeannett Senior, PA-C 08/28 2338  Urinalysis unremarkable. Again patient had normal reflexes, strength, sensation. MRI showed no evidence of cauda equina. Will treat with ibuprofen, prednisone, Flexeril. Follow up with neurosurgery.  Jeannett Senior, PA-C 08/29 [id number]      Final Clinical Impressions(s) / ED Diagnoses   Final diagnoses:  Back pain  Lumbosacral radiculopathy    New Prescriptions New Prescriptions   CYCLOBENZAPRINE (FLEXERIL) 10 MG TABLET    Take 1 tablet (10 mg total) by mouth 2 (two) times daily as needed for muscle spasms.   PREDNISONE (DELTASONE) 20 MG TABLET    Take 2 tablets (40 mg total) by mouth daily.     Jeannett Senior, PA-C 01/09/16 [id number]    Merryl Hacker, MD 01/09/16 2248

## 2016-01-08 NOTE — ED Triage Notes (Signed)
Pt reports lower back from a herniated disc, pt states he can't feel his right leg. Pt was seen at PCP told to come to ED for emergency MRI. Pt states he can't feel his genitales and feels like he is going to loose his bladder at anytime. Pt drove himself to ED with steady gait.

## 2016-01-08 NOTE — Telephone Encounter (Signed)
We cannot rx medications for his pain without evaluation (again, he hasn't been seen in 2 years, and this is a "new"/recurrent problem, not an ongoing issue that he has been getting treated for by [place]).  Try and get him in sooner for an acute visit. If I don't have openings, and he is miserable, offer someone else.  But I should be able to see him for this sooner than 9/11.  Looking at chart, he has a CPE scheduled for 9/11 --there would be a separate OV charge for this acute problem anyway, might as well get seen for this sooner. I have plenty of openings on Wed and Thurs.

## 2016-01-09 LAB — URINALYSIS W MICROSCOPIC (NOT AT ARMC)
Bilirubin Urine: NEGATIVE
Glucose, UA: NEGATIVE mg/dL
Hgb urine dipstick: NEGATIVE
KETONES UR: NEGATIVE mg/dL
LEUKOCYTES UA: NEGATIVE
Nitrite: NEGATIVE
PH: 6.5 (ref 5.0–8.0)
Protein, ur: NEGATIVE mg/dL
SPECIFIC GRAVITY, URINE: 1.026 (ref 1.005–1.030)
SQUAMOUS EPITHELIAL / LPF: NONE SEEN

## 2016-01-09 MED ORDER — CYCLOBENZAPRINE HCL 10 MG PO TABS: 10.0000 mg | ORAL_TABLET | Freq: Two times a day (BID) | ORAL | 0 refills | Status: DC | PRN

## 2016-01-09 MED ORDER — PREDNISONE 20 MG PO TABS: 40.0000 mg | ORAL_TABLET | Freq: Every day | ORAL | 0 refills | Status: DC

## 2016-01-09 NOTE — Discharge Instructions (Signed)
Take prednisone for inflammation as prescribed. Take ibuprofen for pain. Flexeril for spasms. Follow up with your neurosurgeon.

## 2016-01-10 ENCOUNTER — Ambulatory Visit: Payer: Self-pay | Admitting: Family Medicine

## 2016-01-21 NOTE — Progress Notes (Signed)
Chief Complaint  Patient presents with  . Annual Exam    fasting annual exam. Has had some frequent urination, UA fine-did see urology last Thursday.     Kevin Lowe is a 45 y.o. male who presents for a complete physical.    He developed recurrent low back pain 4-5 weeks ago.  He is s/p surgery 02/2012 by Dr. Vertell Limber. He went to ER 01/08/16 --reported that he had onset of pain after lifting something heavy. He then developed some numbness and tingling in the private area and felt pressure on his pelvic floor when voiding. He reported some pain in his scrotum and numbness in his right thigh. Recent MRI done at ER visit showed: IMPRESSION: 1. No spinal canal stenosis or cauda equina compression. 2. Mild-to-moderate right and mild left neural foraminal narrowing at the L4-5 level secondary to disc bulge and facet hypertrophy.  He was treated with muscle relaxants and steroid course.  He feels 100% better now, just some mild contractions in scrotal area, "like ejaculating air", but much less often than prior to ER visit.  He has appointment next week with Dr. Vertell Limber.  He has also discussed urinary frequency with urologist, who didn't recommend any treatment.  He saw urologist last week. PSA is pending.  H/o mild hyperlipidemia:  On a lowfat, low cholesterol diet. He eats healthy, and exercises regularly.  Immunization History  Administered Date(s) Administered  . Influenza Split 02/19/2012  . Influenza Whole 01/16/2011  . Tdap 01/25/2011   Last colonoscopy: never  Last PSA: recently drawn by urologist (results pending) Dentist: twice yearly  Ophtho: never  Exercise: 3-5x/week high intensity interval training (cardio and weights)  Past Medical History:  Diagnosis Date  . Allergic rhinitis, cause unspecified   . Genital warts   . Hyperlipidemia   . Low back pain    resolved s/p surgery  . Ruptured spleen    and pancreas related to soccer injury 1992    Past Surgical History:   Procedure Laterality Date  . CYSTOSCOPY  2007   normal--for urinary frequency  . LUMBAR LAMINECTOMY/DECOMPRESSION MICRODISCECTOMY  02/2012   L4-5, Dr. Vertell Limber  . LUMBAR LAMINECTOMY/DECOMPRESSION MICRODISCECTOMY  02/18/2012   Procedure: LUMBAR LAMINECTOMY/DECOMPRESSION MICRODISCECTOMY;  Surgeon: Erline Levine, MD;  Location: [facility];  Service: Neurosurgery;  Laterality: Right;  Right Lumbar four-five Microdiskectomy  . TONSILLECTOMY  child  . VARICOCELECTOMY  2005   L side    Social History   Social History  . Marital status: Married    Spouse name: Lattie Haw  . Number of children: 2  . Years of education: N/A   Occupational History  . 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 Smoking status: Former Smoker    Quit date: 05/13/2000  . Smokeless tobacco: Never Used  . Alcohol use Yes     Comment: 12 pack per week (6 pack/day on weekends)  . Drug use: No  . Sexual activity: Yes    Partners: Female   Other Topics Concern  . Not on file   Social History Narrative   Married, 2 children. 1 dog    Family History  Problem Relation Age of Onset  . Cancer Mother     breast--mid 60's  . Heart disease Father     CABG in early 38's; pacemaker  . Depression Father   . Cancer Father 12    prostate cancer  . Heart disease Paternal Uncle     11's (died  in 70's)  . Alzheimer's disease Maternal Grandmother   . Heart disease Paternal Grandfather     died in mid-40's from heart disease  . Diabetes Neg Hx     Outpatient Encounter Prescriptions as of 01/22/2016  Medication Sig Note  . fish oil-omega-3 fatty acids 1000 MG capsule Take 2 g by mouth daily.   10/06/2013: Takes daily  . Multiple Vitamins-Minerals (MULTIVITAMIN WITH MINERALS) tablet Take 1 tablet by mouth daily.     Marland Kitchen SPIRULINA PO Take 1 tablet by mouth daily.   . Turmeric, Curcuma Longa, (CURCUMIN) POWD 1 capsule by Does not apply route daily.   . [DISCONTINUED] cyclobenzaprine (FLEXERIL) 10 MG tablet  Take 1 tablet (10 mg total) by mouth 2 (two) times daily as needed for muscle spasms.   . [DISCONTINUED] methocarbamol (ROBAXIN) 500 MG tablet Take 1 tablet (500 mg total) by mouth every 6 (six) hours as needed for muscle spasms. 10/06/2013: Not taking--caused some sedation  . [DISCONTINUED] predniSONE (DELTASONE) 20 MG tablet Take 2 tablets (40 mg total) by mouth daily.    No facility-administered encounter medications on file as of 01/22/2016.     No Known Allergies   ROS: The patient denies anorexia, fever, headaches, vision loss, decreased hearing, ear pain, hoarseness, chest pain, palpitations, dizziness, syncope, dyspnea on exertion, cough, swelling, nausea, vomiting, diarrhea, constipation, abdominal pain, melena, hematochezia, indigestion/heartburn, hematuria, incontinence, erectile dysfunction, nocturia, weakened urine stream, dysuria, genital lesions, numbness, tingling, weakness, tremor, suspicious skin lesions, depression, abnormal bleeding/bruising, or enlarged lymph nodes. Low back pain resolved (see HPI). Some urinary frequency, chronic and unchanged. Up twice/night to void. Drinks a lot of water. Mild allergy symptoms (not requiring medications), congestion.   PHYSICAL EXAM:  BP 110/72 (BP Location: Left Arm, Patient Position: Sitting, Cuff Size: Normal)   Pulse 64   Ht 5' 11.5" (1.816 m)   Wt 180 lb 9.6 oz (81.9 kg)   BMI 24.84 kg/m    General Appearance:  Alert, cooperative, no distress, appears stated age   Head:  Normocephalic, without obvious abnormality, atraumatic. Head is shaven  Eyes:  PERRL, conjunctiva/corneas clear, EOM's intact, fundi  benign   Ears:  Normal TM's and external ear canals   Nose:  Nares normal, mucosa normal, no drainage or sinus tenderness   Throat:  Lips, mucosa, and tongue normal; teeth and gums normal. Braces.   Neck:  Supple, no lymphadenopathy; thyroid: no enlargement/tenderness/nodules; no carotid bruit or JVD   Back:  Spine  nontender, no curvature, ROM normal, no CVA Tenderness.  There is no c-spine tenderness or muscle spasm.  Neck is nontender  Lungs:  Clear to auscultation bilaterally without wheezes, rales or ronchi; respirations unlabored   Chest Wall:  No tenderness or deformity   Heart:  Regular rate and rhythm, S1 and S2 normal, no murmur, rub or gallop   Breast Exam:  No chest wall tenderness, masses or gynecomastia   Abdomen:  Soft, non-tender, nondistended, normoactive bowel sounds, no masses, no hepatosplenomegaly   Genitalia:  Deferred to urologist   Rectal:  Deferred to urologist (declined by pt--not actually performed at urologist office either, per pt)  Extremities:  No clubbing, cyanosis or edema   Pulses:  2+ and symmetric all extremities   Skin:  Skin color, texture, turgor normal, no rashes or lesions   Lymph nodes:  Cervical, supraclavicular, and axillary nodes normal   Neurologic:  CNII-XII intact, normal strength, sensation and gait; reflexes 2+ and symmetric throughout.  Psych: Normal mood, affect, hygiene and grooming  Normal urine dip   ASSESSMENT/PLAN:  Annual physical exam - Plan: POCT Urinalysis Dipstick, Visual acuity screening, Lipid panel, Comprehensive metabolic panel, CBC with Differential/Platelet  Hyperlipidemia - Plan: Lipid panel  Need for prophylactic vaccination and inoculation against influenza - Plan: Flu Vaccine QUAD 36+ mos PF IM (Fluarix & Fluzone Quad PF)  Urinary frequency  Lumbar radiculopathy - resolved, s/p course of steroids and muscle relaxants. to f/u with Dr. Vertell Limber next week  Engages in binge consumption of alcohol - risks reviewed; encouraged no more than 2-3 drinks max    Lipids, c-met, CBC  Recommended at least 30 minutes of aerobic activity at least 5 days/week; proper sunscreen use reviewed; healthy diet and alcohol recommendations (less than or equal to 2 drinks/day) reviewed; regular seatbelt use; changing batteries in smoke  detectors. Self-testicular exams. Immunization recommendations discussed--yearly flu shots encouraged. Colonoscopy recommendations reviewed--age 3.  ophtho encouraged  Urinary frequency--discussed behavioral measures (decrease caffeine, alcohol, limit fluids in evening, etc).

## 2016-01-22 ENCOUNTER — Ambulatory Visit (INDEPENDENT_AMBULATORY_CARE_PROVIDER_SITE_OTHER): Payer: BLUE CROSS/BLUE SHIELD | Admitting: Family Medicine

## 2016-01-22 ENCOUNTER — Encounter: Payer: Self-pay | Admitting: Family Medicine

## 2016-01-22 VITALS — BP 110/72 | HR 64 | Ht 71.5 in | Wt 180.6 lb

## 2016-01-22 DIAGNOSIS — F1010 Alcohol abuse, uncomplicated: Secondary | ICD-10-CM | POA: Diagnosis not present

## 2016-01-22 DIAGNOSIS — E785 Hyperlipidemia, unspecified: Secondary | ICD-10-CM

## 2016-01-22 DIAGNOSIS — R350 Frequency of micturition: Secondary | ICD-10-CM

## 2016-01-22 DIAGNOSIS — Z0000 Encounter for general adult medical examination without abnormal findings: Secondary | ICD-10-CM | POA: Diagnosis not present

## 2016-01-22 DIAGNOSIS — Z23 Encounter for immunization: Secondary | ICD-10-CM

## 2016-01-22 DIAGNOSIS — M5416 Radiculopathy, lumbar region: Secondary | ICD-10-CM | POA: Diagnosis not present

## 2016-01-22 LAB — COMPREHENSIVE METABOLIC PANEL
ALBUMIN: 4.7 g/dL (ref 3.6–5.1)
ALT: 34 U/L (ref 9–46)
AST: 23 U/L (ref 10–40)
Alkaline Phosphatase: 46 U/L (ref 40–115)
BUN: 16 mg/dL (ref 7–25)
CALCIUM: 9.6 mg/dL (ref 8.6–10.3)
CHLORIDE: 102 mmol/L (ref 98–110)
CO2: 31 mmol/L (ref 20–31)
CREATININE: 0.85 mg/dL (ref 0.60–1.35)
Glucose, Bld: 108 mg/dL — ABNORMAL HIGH (ref 65–99)
Potassium: 4.4 mmol/L (ref 3.5–5.3)
SODIUM: 139 mmol/L (ref 135–146)
Total Bilirubin: 0.5 mg/dL (ref 0.2–1.2)
Total Protein: 7.1 g/dL (ref 6.1–8.1)

## 2016-01-22 LAB — CBC WITH DIFFERENTIAL/PLATELET
BASOS PCT: 0 %
Basophils Absolute: 0 cells/uL (ref 0–200)
EOS ABS: 105 {cells}/uL (ref 15–500)
Eosinophils Relative: 3 %
HEMATOCRIT: 46.4 % (ref 38.5–50.0)
HEMOGLOBIN: 16 g/dL (ref 13.2–17.1)
Lymphocytes Relative: 37 %
Lymphs Abs: 1295 cells/uL (ref 850–3900)
MCH: 30.5 pg (ref 27.0–33.0)
MCHC: 34.5 g/dL (ref 32.0–36.0)
MCV: 88.5 fL (ref 80.0–100.0)
MONO ABS: 315 {cells}/uL (ref 200–950)
MPV: 10.9 fL (ref 7.5–12.5)
Monocytes Relative: 9 %
NEUTROS ABS: 1785 {cells}/uL (ref 1500–7800)
Neutrophils Relative %: 51 %
Platelets: 222 10*3/uL (ref 140–400)
RBC: 5.24 MIL/uL (ref 4.20–5.80)
RDW: 13.2 % (ref 11.0–15.0)
WBC: 3.5 10*3/uL — ABNORMAL LOW (ref 4.0–10.5)

## 2016-01-22 LAB — POCT URINALYSIS DIPSTICK
Bilirubin, UA: NEGATIVE
Blood, UA: NEGATIVE
GLUCOSE UA: NEGATIVE
KETONES UA: NEGATIVE
LEUKOCYTES UA: NEGATIVE
Nitrite, UA: NEGATIVE
PROTEIN UA: NEGATIVE
Spec Grav, UA: 1.025
Urobilinogen, UA: NEGATIVE
pH, UA: 6.5

## 2016-01-22 LAB — LIPID PANEL
CHOL/HDL RATIO: 3.6 ratio (ref <=5.0)
Cholesterol: 171 mg/dL (ref 125–200)
HDL: 48 mg/dL (ref >=40)
LDL Cholesterol: 95 mg/dL (ref <130)
Triglycerides: 141 mg/dL (ref <150)
VLDL: 28 mg/dL (ref <30)

## 2016-01-22 NOTE — Patient Instructions (Signed)
  HEALTH MAINTENANCE RECOMMENDATIONS:  It is recommended that you get at least 30 minutes of aerobic exercise at least 5 days/week (for weight loss, you may need as much as 60-90 minutes). This can be any activity that gets your heart rate up. This can be divided in 10-15 minute intervals if needed, but try and build up your endurance at least once a week.  Weight bearing exercise is also recommended twice weekly.  Eat a healthy diet with lots of vegetables, fruits and fiber.  "Colorful" foods have a lot of vitamins (ie green vegetables, tomatoes, red peppers, etc).  Limit sweet tea, regular sodas and alcoholic beverages, all of which has a lot of calories and sugar.  Up to 2 alcoholic drinks daily may be beneficial for men (unless trying to lose weight, watch sugars).  Drink a lot of water.  Sunscreen of at least SPF 30 should be used on all sun-exposed parts of the skin when outside between the hours of 10 am and 4 pm (not just when at beach or pool, but even with exercise, golf, tennis, and yard work!)  Use a sunscreen that says "broad spectrum" so it covers both UVA and UVB rays, and make sure to reapply every 1-2 hours.  Remember to change the batteries in your smoke detectors when changing your clock times in the spring and fall.  Use your seat belt every time you are in a car, and please drive safely and not be distracted with cell phones and texting while driving.  Please schedule a routine eye exam. Please cut back on alcohol intake, to no more than 3 at a time.

## 2016-11-11 DIAGNOSIS — D2262 Melanocytic nevi of left upper limb, including shoulder: Secondary | ICD-10-CM | POA: Diagnosis not present

## 2016-11-11 DIAGNOSIS — D2261 Melanocytic nevi of right upper limb, including shoulder: Secondary | ICD-10-CM | POA: Diagnosis not present

## 2016-11-11 DIAGNOSIS — D225 Melanocytic nevi of trunk: Secondary | ICD-10-CM | POA: Diagnosis not present

## 2017-01-21 NOTE — Progress Notes (Signed)
Chief Complaint  Patient presents with  . Annual Exam    fasting annual exam. Has been having some issues with diarrhea lately and would like to discuss. No other concerns.     Kevin Lowe is a 46 y.o. male who presents for a complete physical.  He has the following concerns:  Last Thursday he had diarrhea, headache, missed work, figured it was a "bug".  One day was watery, frequent diarrhea.  Since then, having loose stools after eating only since then. He has been having yogurt, salads, chia seeds, coffee. For a long time he has had urgency and loose stool after lunch when he has protein shake with chia seeds, and spinach salad.  He has h/o back problems, s/p surgery.  Last flare and MRI was a year ago. MRI showed IMPRESSION: 1. No spinal canal stenosis or cauda equina compression. 2. Mild-to-moderate right and mild left neural foraminal narrowing at the L4-5 level secondary to disc bulge and facet hypertrophy.  He was treated with muscle relaxants and steroid course. He saw Dr. Vertell Limber in follow-up.  He hasn't had any problems over the last year.  Urinary problems (urgency/frequency related to spasms) cleared up after his back improved with steroids.  He still has frequent urination.  He knows he has a large prostate.  He saw urologist last year, but doesn't plan to follow up there regularly.  He drinks a lot of water, 2 cups coffee/d.  H/o mild hyperlipidemia: On a lowfat, low cholesterol diet. He eats healthy, and exercises regularly. Lipids were improved last year.  Lab Results  Component Value Date   CHOL 171 01/22/2016   HDL 48 01/22/2016   LDLCALC 95 01/22/2016   TRIG 141 01/22/2016   CHOLHDL 3.6 01/22/2016   He was noted to have elevated fasting glucose last year, sugar 108  Immunization History  Administered Date(s) Administered  . Influenza Split 02/19/2012  . Influenza Whole 01/16/2011  . Influenza,inj,Quad PF,6+ Mos 01/22/2016  . Tdap 01/25/2011   Last  colonoscopy: never  Last PSA: last year by urology Dentist: twice yearly  Ophtho: went this year Exercise: 3-4x/week high intensity interval training (cardio and weights)  Past Medical History:  Diagnosis Date  . Allergic rhinitis, cause unspecified   . Genital warts   . Hyperlipidemia   . Low back pain    resolved s/p surgery  . Ruptured spleen    and pancreas related to soccer injury 1992  . Urinary frequency     Past Surgical History:  Procedure Laterality Date  . CYSTOSCOPY  2007   normal--for urinary frequency  . LUMBAR LAMINECTOMY/DECOMPRESSION MICRODISCECTOMY  02/2012   L4-5, Dr. Vertell Limber  . LUMBAR LAMINECTOMY/DECOMPRESSION MICRODISCECTOMY  02/18/2012   Procedure: LUMBAR LAMINECTOMY/DECOMPRESSION MICRODISCECTOMY;  Surgeon: Erline Levine, MD;  Location: [facility];  Service: Neurosurgery;  Laterality: Right;  Right Lumbar four-five Microdiskectomy  . TONSILLECTOMY  child  . VARICOCELECTOMY  2005   L side    Social History   Social History  . Marital status: Married    Spouse name: Lattie Haw  . Number of children: 2  . Years of education: N/A   Occupational History  . 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 Smoking status: Former Smoker    Quit date: 05/13/2000  . Smokeless tobacco: Never Used  . Alcohol use Yes     Comment: 12 pack per week (6 pack/day on weekends)  . Drug use: No  . Sexual activity: Yes  Partners: Female   Other Topics Concern  . Not on file   Social History Narrative   Married, 2 children. 1 dog    Family History  Problem Relation Age of Onset  . Cancer Mother        breast--mid 23's  . Heart disease Father        CABG in early 70's; pacemaker  . Depression Father   . Cancer Father 64       prostate cancer  . Alcoholism Brother   . Heart disease Paternal Uncle        29's (died in 29's)  . Alzheimer's disease Maternal Grandmother   . Heart disease Paternal Grandfather        died in mid-40's from heart  disease  . Diabetes Neg Hx     Outpatient Encounter Prescriptions as of 01/23/2017  Medication Sig Note  . fish oil-omega-3 fatty acids 1000 MG capsule Take 2 g by mouth daily.   10/06/2013: Takes daily  . Multiple Vitamins-Minerals (MULTIVITAMIN WITH MINERALS) tablet Take 1 tablet by mouth daily.     Marland Kitchen SPIRULINA PO Take 1 tablet by mouth daily.   . Turmeric, Curcuma Longa, (CURCUMIN) POWD 1 capsule by Does not apply route daily.    No facility-administered encounter medications on file as of 01/23/2017.     No Known Allergies   ROS: The patient denies anorexia, fever, headaches, vision loss (needs reading glasses now), decreased hearing, ear pain, hoarseness, chest pain, palpitations, dizziness, syncope, dyspnea on exertion, cough, swelling, nausea, vomiting, diarrhea, constipation, abdominal pain, melena, hematochezia, indigestion/heartburn, hematuria, incontinence, erectile dysfunction, nocturia, weakened urine stream, dysuria, genital lesions, numbness, tingling, weakness, tremor, suspicious skin lesions, depression, abnormal bleeding/bruising, or enlarged lymph nodes. No back pain in the last year. Some urinary frequency, chronic and unchanged. Up 2-3/night to void. Drinks a lot of water. Sees dermatologist regularly, recent without problems. Diarrhea as per HPI--no abdominal pain or blood in the stool.   PHYSICAL EXAM:  BP 120/86 (BP Location: Right Arm, Patient Position: Sitting, Cuff Size: Normal)   Pulse 68   Ht '5\' 11"'$  (1.803 m)   Wt 181 lb 9.6 oz (82.4 kg)   BMI 25.33 kg/m   Wt Readings from Last 3 Encounters:  01/23/17 181 lb 9.6 oz (82.4 kg)  01/22/16 180 lb 9.6 oz (81.9 kg)  01/08/16 181 lb 9 oz (82.4 kg)   114/80 on repeat by MD  General Appearance:  Alert, cooperative, no distress, appears stated age   Head:  Normocephalic, without obvious abnormality, atraumatic. Head is shaven  Eyes:  PERRL, conjunctiva/corneas clear, EOM's intact, fundi benign   Ears:  Normal  TM's and external ear canals   Nose:  Nares normal, mucosa normal, no drainage or sinus tenderness   Throat:  Lips, mucosa, and tongue normal; teeth and gums normal.   Neck:  Supple, no lymphadenopathy; thyroid: no enlargement/tenderness/nodules; no carotid bruit or JVD   Back:  Spine nontender, no curvature, ROM normal, no CVA tenderness.   Lungs:  Clear to auscultation bilaterally without wheezes, rales or ronchi; respirations unlabored   Chest Wall:  No tenderness or deformity   Heart:  Regular rate and rhythm, S1 and S2 normal, no murmur, rub or gallop   Breast Exam:  No chest wall tenderness, masses or gynecomastia   Abdomen:  Soft, non-tender, nondistended, normoactive bowel sounds, no masses, no hepatosplenomegaly   Genitalia:  Normal external genitalia without lesions. Testicles descended bilaterally, no masses.  No inguinal hernias  Rectal:  Normal sphincter tone, no mass, heme negative stool.  Prostate is mildly enlarged for age, smooth, no nodules or asymmetry  Extremities:  No clubbing, cyanosis or edema   Pulses:  2+ and symmetric all extremities   Skin:  Skin color, texture, turgor normal, no rashes or lesions   Lymph nodes:  Cervical, supraclavicular, and axillary nodes normal   Neurologic:  CNII-XII intact, normal strength, sensation and gait; reflexes 2+ and symmetric throughout.    Psych: Normal mood, affect, hygiene and grooming  Urine dip normal   ASSESSMENT/PLAN:  Annual physical exam - Plan: POCT Urinalysis DIP (Proadvantage Device), Visual acuity screening, Comprehensive metabolic panel, CBC with Differential/Platelet, Lipid panel, PSA, Comprehensive metabolic panel, CBC with Differential/Platelet, Lipid panel, PSA  Pure hypercholesterolemia - Plan: Lipid panel, Lipid panel  Impaired fasting glucose - elevated last year (perhaps related to steroids for back?).  Recheck. no FH diabetes - Plan: HgB A1c, Comprehensive metabolic panel, Hemoglobin A1c,  Comprehensive metabolic panel, Hemoglobin A1c  Need for influenza vaccination - Plan: Flu Vaccine QUAD 6+ mos PF IM (Fluarix Quad PF)  Family history of prostate cancer - in father, age 1 - Plan: PSA, PSA  Screening for prostate cancer - Plan: PSA, PSA  Diarrhea, unspecified type - suspect temporary lactose intolerance after recent viral illness.  Supportive measures reviewed.  - Plan: Comprehensive metabolic panel, CBC with Differential/Platelet, Comprehensive metabolic panel, CBC with Differential/Platelet   Flu shot c-met, A1c, lipids, PSA, CBC   Recommended at least 30 minutes of aerobic activity at least 5 days/week; proper sunscreen use reviewed; healthy diet and alcohol recommendations (less than or equal to 2 drinks/day--needs to cut back on weekend alcohol consumption) reviewed; regular seatbelt use; changing batteries in smoke detectors. Self-testicular exams. Immunization recommendations discussed--yearly flu shots encouraged. Colonoscopy recommendations reviewed--age 54.  Risks/recommendations for PSA screening reviewed in detail.   F/u 1 year, sooner prn

## 2017-01-23 ENCOUNTER — Ambulatory Visit (INDEPENDENT_AMBULATORY_CARE_PROVIDER_SITE_OTHER): Payer: 59 | Admitting: Family Medicine

## 2017-01-23 ENCOUNTER — Encounter: Payer: Self-pay | Admitting: Family Medicine

## 2017-01-23 VITALS — BP 114/80 | HR 68 | Ht 71.0 in | Wt 181.6 lb

## 2017-01-23 DIAGNOSIS — Z0000 Encounter for general adult medical examination without abnormal findings: Secondary | ICD-10-CM

## 2017-01-23 DIAGNOSIS — Z125 Encounter for screening for malignant neoplasm of prostate: Secondary | ICD-10-CM | POA: Diagnosis not present

## 2017-01-23 DIAGNOSIS — R7301 Impaired fasting glucose: Secondary | ICD-10-CM | POA: Diagnosis not present

## 2017-01-23 DIAGNOSIS — R197 Diarrhea, unspecified: Secondary | ICD-10-CM

## 2017-01-23 DIAGNOSIS — Z8042 Family history of malignant neoplasm of prostate: Secondary | ICD-10-CM | POA: Diagnosis not present

## 2017-01-23 DIAGNOSIS — Z23 Encounter for immunization: Secondary | ICD-10-CM

## 2017-01-23 DIAGNOSIS — E7800 Pure hypercholesterolemia, unspecified: Secondary | ICD-10-CM

## 2017-01-23 LAB — POCT URINALYSIS DIP (PROADVANTAGE DEVICE)
Bilirubin, UA: NEGATIVE
Blood, UA: NEGATIVE
GLUCOSE UA: NEGATIVE mg/dL
Ketones, POC UA: NEGATIVE mg/dL
LEUKOCYTES UA: NEGATIVE
Nitrite, UA: NEGATIVE
Protein Ur, POC: NEGATIVE mg/dL
Specific Gravity, Urine: 1.025
UUROB: NEGATIVE
pH, UA: 6 (ref 5.0–8.0)

## 2017-01-23 LAB — POCT GLYCOSYLATED HEMOGLOBIN (HGB A1C): HEMOGLOBIN A1C: 5.1

## 2017-01-23 NOTE — Patient Instructions (Addendum)
HEALTH MAINTENANCE RECOMMENDATIONS:  It is recommended that you get at least 30 minutes of aerobic exercise at least 5 days/week (for weight loss, you may need as much as 60-90 minutes). This can be any activity that gets your heart rate up. This can be divided in 10-15 minute intervals if needed, but try and build up your endurance at least once a week.  Weight bearing exercise is also recommended twice weekly.  Eat a healthy diet with lots of vegetables, fruits and fiber.  "Colorful" foods have a lot of vitamins (ie green vegetables, tomatoes, red peppers, etc).  Limit sweet tea, regular sodas and alcoholic beverages, all of which has a lot of calories and sugar.  Up to 2 alcoholic drinks daily may be beneficial for men (unless trying to lose weight, watch sugars).  Drink a lot of water.  Sunscreen of at least SPF 30 should be used on all sun-exposed parts of the skin when outside between the hours of 10 am and 4 pm (not just when at beach or pool, but even with exercise, golf, tennis, and yard work!)  Use a sunscreen that says "broad spectrum" so it covers both UVA and UVB rays, and make sure to reapply every 1-2 hours.  Remember to change the batteries in your smoke detectors when changing your clock times in the spring and fall.  Use your seat belt every time you are in a car, and please drive safely and not be distracted with cell phones and texting while driving.  As we discussed, please cut back the amount of alcohol on the weekends (try and not have more than 3/night).  Try taking a probiotic daily for the next 2-4 weeks (ie Align, Florastar); avoid all dairy for at least 7 days.  Consider taking Beano prior to the meals that trigger diarrhea (chronically, not related to this illness--ie your spinach salad lunch). Let us know if symptoms persist or worsen, if any weight loss, blood in the stool, abdominal pain not related to the diarrhea.  Lactose-Free Diet, Adult If you have lactose  intolerance, you are not able to digest lactose. Lactose is a natural sugar found mainly in milk and milk products. You may need to avoid all foods and beverages that contain lactose. A lactose-free diet can help you do this. What do I need to know about this diet?  Do not consume foods, beverages, vitamins, minerals, or medicines with lactose. Read ingredients lists carefully.  Look for the words "lactose-free" on labels.  Use lactase enzyme drops or tablets as directed by your health care provider.  Use lactose-free milk or a milk alternative, such as soy milk, for drinking and cooking.  Make sure you get enough calcium and vitamin D in your diet. A lactose-free eating plan can be lacking in these important nutrients.  Take calcium and vitamin D supplements as directed by your health care provider. Talk to your provider about supplements if you are not able to get enough calcium and vitamin D from food. Which foods have lactose? Lactose is found in:  Milk and foods made from milk.  Yogurt.  Cheese.  Butter.  Margarine.  Sour cream.  Cream.  Whipped toppings and nondairy creamers.  Ice cream and other milk-based desserts.  Lactose is also found in foods or products made with milk or milk ingredients. To find out whether a food contains milk or a milk ingredient, look at the ingredients list. Avoid foods with the statement "May contain milk" and foods  that contain:  Butter.  Cream.  Milk.  Milk solids.  Milk powder.  Whey.  Curd.  Caseinate.  Lactose.  Lactalbumin.  Lactoglobulin.  What are some alternatives to milk and foods made with milk products?  Lactose-free milk.  Soy milk with added calcium and vitamin D.  Almond, coconut, or rice milk with added calcium and vitamin D. Note that these are low in protein.  Soy products, such as soy yogurt, soy cheese, soy ice cream, and soy-based sour cream. Which foods can I eat? Grains Breads and rolls  made without milk, such as [place], [place], or [place] bread, bagels, pita, and [employer]. Corn tortillas, corn meal, grits, and polenta. Crackers without lactose or milk solids, such as soda crackers and graham crackers. Cooked or dry cereals without lactose or milk solids. Pasta, quinoa, couscous, barley, oats, bulgur, farro, rice, wild rice, or other grains prepared without milk or lactose. Plain popcorn. Vegetables Fresh, frozen, and canned vegetables without cheese, cream, or butter sauces. Fruits All fresh, canned, frozen, or dried fruits that are not processed with lactose. Meats and Other Protein Sources Plain beef, chicken, fish, [place], lamb, veal, pork, wild game, or ham. Kosher-prepared meat products. Strained or junior meats that do not contain milk. Eggs. Soy meat substitutes. Beans, lentils, and hummus. Tofu. Nuts and seeds. Peanut or other nut butters without lactose. Soups, casseroles, and mixed dishes without cheese, cream, or milk. Dairy Lactose-free milk. Soy, rice, or almond milk with added calcium and vitamin D. Soy cheese and yogurt. Beverages Carbonated drinks. Tea. Coffee, freeze-dried coffee, and some instant coffees. Fruit and vegetable juices. Condiments Soy sauce. Carob powder. Olives. Gravy made with water. Baker's cocoa. Kevin Lowe. Pure seasonings and spices. Ketchup. Mustard. Bouillon. Broth. Sweets and Desserts Water and fruit ices. Gelatin. Cookies, pies, or cakes made from allowed ingredients, such as angel food cake. Pudding made with water or a milk substitute. Lactose-free tofu desserts. Soy, coconut milk, or rice-milk-based frozen desserts. Sugar. Honey. Jam, jelly, and marmalade. Molasses. Pure sugar candy. Dark chocolate without milk. Marshmallows. Fats and Oils Margarines and salad dressings that do not contain milk. Kevin Lowe. Vegetable oils. Shortening. Mayonnaise. Soy or coconut-based cream. The items listed above may not be a complete list of recommended foods or  beverages. Contact your dietitian for more options. Which foods are not recommended? Grains Breads and rolls that contain milk. Toaster pastries. Muffins, biscuits, waffles, cornbread, and pancakes. These can be prepared at home, commercial, or from mixes. Sweet rolls, donuts, English muffins, fry bread, lefse, flour tortillas with lactose, or [place] toast made with milk or milk ingredients. Crackers that contain lactose. Corn curls. Cooked or dry cereals with lactose. Vegetables Creamed or breaded vegetables. Vegetables in a cheese or butter sauce or with lactose-containing margarines. Instant potatoes. [place] fries. Scalloped or au gratin potatoes. Fruits None. Meats and Other Protein Sources Scrambled eggs, omelets, and souffles that contain milk. Creamed or breaded meat, fish, chicken, or [place]. Sausage products, such as wieners and liver sausage. Cold cuts that contain milk solids. Cheese, cottage cheese, ricotta cheese, and cheese spreads. Lasagna and macaroni and cheese. Pizza. Peanut or other nut butters with added milk solids. Casseroles or mixed dishes containing milk or cheese. Dairy All dairy products, including milk, goat's milk, buttermilk, kefir, acidophilus milk, flavored milk, evaporated milk, condensed milk, dulce de Cla[place], eggnog, yogurt, cheese, and cheese spreads. Beverages Hot chocolate. Cocoa with lactose. Instant iced teas. Powdered fruit drinks. Smoothies made with milk or yogurt. Condiments Chewing gum that has  lactose. Cocoa that has lactose. Spice blends if they contain milk products. Artificial sweeteners that contain lactose. Nondairy creamers. Sweets and Desserts Ice cream, ice milk, gelato, sherbet, and frozen yogurt. Custard, pudding, and mousse. Cake, cream pies, cookies, and other desserts containing milk, cream, cream cheese, or milk chocolate. Pie crust made with milk-containing margarine or butter. Reduced-calorie desserts made with a sugar substitute that  contains lactose. Toffee and butterscotch. Milk, white, or dark chocolate that contains milk. Fudge. Caramel. Fats and Oils Margarines and salad dressings that contain milk or cheese. Cream. Half and half. Cream cheese. Sour cream. Chip dips made with sour cream or yogurt. The items listed above may not be a complete list of foods and beverages to avoid. Contact your dietitian for more information. Am I getting enough calcium? Calcium is found in many foods that contain lactose and is important for bone health. The amount of calcium you need depends on your age:  Adults younger than 50 years: 1000 mg of calcium a day.  Adults older than 50 years: 1200 mg of calcium a day.  If you are not getting enough calcium, other calcium sources include:  Orange juice with calcium added. There are 300-350 mg of calcium in 1 cup of orange juice.  Sardines with edible bones. There are 325 mg of calcium in 3 oz of sardines.  Calcium-fortified soy milk. There are 300-400 mg of calcium in 1 cup of calcium-fortified soy milk.  Calcium-fortified rice or almond milk. There are 300 mg of calcium in 1 cup of calcium-fortified rice or almond milk.  Canned salmon with edible bones. There are 180 mg of calcium in 3 oz of canned salmon with edible bones.  Calcium-fortified breakfast cereals. There are [phone] mg of calcium in calcium-fortified breakfast cereals.  Tofu set with calcium sulfate. There are 250 mg of calcium in  cup of tofu set with calcium sulfate.  Spinach, cooked. There are 145 mg of calcium in  cup of cooked spinach.  Edamame, cooked. There are 130 mg of calcium in  cup of cooked edamame.  Collard greens, cooked. There are 125 mg of calcium in  cup of cooked collard greens.  Kale, frozen or cooked. There are 90 mg of calcium in  cup of cooked or frozen kale.  Almonds. There are 95 mg of calcium in  cup of almonds.  Broccoli, cooked. There are 60 mg of calcium in 1 cup of cooked  broccoli.  This information is not intended to replace advice given to you by your health care provider. Make sure you discuss any questions you have with your health care provider. Document Released: 10/19/2001 Document Revised: 10/05/2015 Document Reviewed: 07/30/2013 Elsevier Interactive Patient Education  2018 [employer].

## 2017-01-24 LAB — CBC WITH DIFFERENTIAL/PLATELET
BASOS ABS: 20 {cells}/uL (ref 0–200)
BASOS PCT: 0.5 %
EOS ABS: 70 {cells}/uL (ref 15–500)
EOS PCT: 1.8 %
HEMATOCRIT: 46.9 % (ref 38.5–50.0)
HEMOGLOBIN: 16 g/dL (ref 13.2–17.1)
LYMPHS ABS: 1650 {cells}/uL (ref 850–3900)
MCH: 30.3 pg (ref 27.0–33.0)
MCHC: 34.1 g/dL (ref 32.0–36.0)
MCV: 88.8 fL (ref 80.0–100.0)
MONOS PCT: 10.6 %
MPV: 10.8 fL (ref 7.5–12.5)
NEUTROS ABS: 1747 {cells}/uL (ref 1500–7800)
Neutrophils Relative %: 44.8 %
Platelets: 263 10*3/uL (ref 140–400)
RBC: 5.28 10*6/uL (ref 4.20–5.80)
RDW: 12.2 % (ref 11.0–15.0)
Total Lymphocyte: 42.3 %
WBC mixed population: 413 cells/uL (ref 200–950)
WBC: 3.9 10*3/uL (ref 3.8–10.8)

## 2017-01-24 LAB — HEMOGLOBIN A1C
Hgb A1c MFr Bld: 5 % of total Hgb (ref <5.7)
MEAN PLASMA GLUCOSE: 97 (calc)
eAG (mmol/L): 5.4 (calc)

## 2017-01-24 LAB — COMPREHENSIVE METABOLIC PANEL
AG RATIO: 2 (calc) (ref 1.0–2.5)
ALT: 40 U/L (ref 9–46)
AST: 46 U/L — AB (ref 10–40)
Albumin: 4.7 g/dL (ref 3.6–5.1)
Alkaline phosphatase (APISO): 48 U/L (ref 40–115)
BUN: 14 mg/dL (ref 7–25)
CO2: 29 mmol/L (ref 20–32)
Calcium: 9.6 mg/dL (ref 8.6–10.3)
Chloride: 101 mmol/L (ref 98–110)
Creat: 0.99 mg/dL (ref 0.60–1.35)
GLUCOSE: 94 mg/dL (ref 65–99)
Globulin: 2.4 g/dL (calc) (ref 1.9–3.7)
Potassium: 4.3 mmol/L (ref 3.5–5.3)
SODIUM: 138 mmol/L (ref 135–146)
TOTAL PROTEIN: 7.1 g/dL (ref 6.1–8.1)
Total Bilirubin: 0.6 mg/dL (ref 0.2–1.2)

## 2017-01-24 LAB — LIPID PANEL
CHOL/HDL RATIO: 3.7 (calc) (ref <5.0)
CHOLESTEROL: 190 mg/dL (ref <200)
HDL: 52 mg/dL (ref >40)
LDL Cholesterol (Calc): 121 mg/dL (calc) — ABNORMAL HIGH
NON-HDL CHOLESTEROL (CALC): 138 mg/dL — AB (ref <130)
Triglycerides: 78 mg/dL (ref <150)

## 2017-01-24 LAB — PSA: PSA: 0.5 ng/mL (ref <=4.0)

## 2017-11-11 DIAGNOSIS — D225 Melanocytic nevi of trunk: Secondary | ICD-10-CM | POA: Diagnosis not present

## 2017-11-11 DIAGNOSIS — D485 Neoplasm of uncertain behavior of skin: Secondary | ICD-10-CM | POA: Diagnosis not present

## 2017-11-11 DIAGNOSIS — D224 Melanocytic nevi of scalp and neck: Secondary | ICD-10-CM | POA: Diagnosis not present

## 2018-01-28 NOTE — Patient Instructions (Addendum)
  HEALTH MAINTENANCE RECOMMENDATIONS:  It is recommended that you get at least 30 minutes of aerobic exercise at least 5 days/week (for weight loss, you may need as much as 60-90 minutes). This can be any activity that gets your heart rate up. This can be divided in 10-15 minute intervals if needed, but try and build up your endurance at least once a week.  Weight bearing exercise is also recommended twice weekly.  Eat a healthy diet with lots of vegetables, fruits and fiber.  "Colorful" foods have a lot of vitamins (ie green vegetables, tomatoes, red peppers, etc).  Limit sweet tea, regular sodas and alcoholic beverages, all of which has a lot of calories and sugar.  Up to 2 alcoholic drinks daily may be beneficial for men (unless trying to lose weight, watch sugars).  Drink a lot of water.  Sunscreen of at least SPF 30 should be used on all sun-exposed parts of the skin when outside between the hours of 10 am and 4 pm (not just when at beach or pool, but even with exercise, golf, tennis, and yard work!)  Use a sunscreen that says "broad spectrum" so it covers both UVA and UVB rays, and make sure to reapply every 1-2 hours.  Remember to change the batteries in your smoke detectors when changing your clock times in the spring and fall.  Use your seat belt every time you are in a car, and please drive safely and not be distracted with cell phones and texting while driving.  We discussed Saw Palmetto as an herbal treatment for enlarged prostate symptoms. We also discussed use of probiotics (either in yogurt form such as Activia or pill form such as Align, Florastar, etc) and metamucil as a fiber supplement to bulk up stools.  Eating the fruits/vegetables provides more fiber than drinking them.  We discussed cutting back on alcohol to no more than 2-3 drinks at a time.

## 2018-01-28 NOTE — Progress Notes (Signed)
Chief Complaint  Patient presents with  . Annual Exam    fasting annual exam.Just had eye exam. No concerns.     Kevin Lowe is a 47 y.o. male who presents for a complete physical.   H/o LBP, s/p surgery.  Last MRI was in 12/2015 which showed: IMPRESSION: 1. No spinal canal stenosis or cauda equina compression. 2. Mild-to-moderate right and mild left neural foraminal narrowing at the L4-5 level secondary to disc bulge and facet hypertrophy.  At that time (2017) he was having flare of pain, treated with muscle relaxants and steroid course under the care of Dr. Vertell Limber.  He has done well with no further problems.  He reports some chronic frequent urination, reports his prostate is enlarged. He saw urologist in the past (not following there).  He drinks a lot of water, 2 cups coffee/d.  Stream is weaker at night, up 3x/night. Stream is fine during the day.  H/o mild hyperlipidemia: Cholesterol had improved with dietary changes years ago.  He continues to follow a lowfat, low cholesterol diet. He eats healthy, and exercises regularly. Lab Results  Component Value Date   CHOL 190 01/23/2017   HDL 52 01/23/2017   LDLCALC 121 (H) 01/23/2017   TRIG 78 01/23/2017   CHOLHDL 3.7 01/23/2017   He was noted to have elevated fasting glucose in 2017, sugar 108. Glucose and A1c were normal last year.     Immunization History  Administered Date(s) Administered  . Influenza Split 02/19/2012  . Influenza Whole 01/16/2011  . Influenza,inj,Quad PF,6+ Mos 01/22/2016, 01/23/2017  . Tdap 01/25/2011   Last colonoscopy: never  Last PSA:  Lab Results  Component Value Date   PSA 0.5 01/23/2017   PSA 0.51 10/06/2013  (had one done in 2017 also, at urologist) Dentist: twice yearly  Ophtho: last year Exercise: 3-4x/week high intensity interval training (cardio and weights)  Past Medical History:  Diagnosis Date  . Allergic rhinitis, cause unspecified   . Genital warts   . Hyperlipidemia    . Low back pain    resolved s/p surgery  . Ruptured spleen    and pancreas related to soccer injury 1992  . Urinary frequency     Past Surgical History:  Procedure Laterality Date  . CYSTOSCOPY  2007   normal--for urinary frequency  . LUMBAR LAMINECTOMY/DECOMPRESSION MICRODISCECTOMY  02/2012   L4-5, Dr. Vertell Limber  . LUMBAR LAMINECTOMY/DECOMPRESSION MICRODISCECTOMY  02/18/2012   Procedure: LUMBAR LAMINECTOMY/DECOMPRESSION MICRODISCECTOMY;  Surgeon: Erline Levine, MD;  Location: [facility];  Service: Neurosurgery;  Laterality: Right;  Right Lumbar four-five Microdiskectomy  . TONSILLECTOMY  child  . VARICOCELECTOMY  2005   L side    Social History   Socioeconomic History  . Marital status: Married    Spouse name: Lattie Haw  . Number of children: 2  . Years of education: Not on file  . Highest education level: Not on file  Occupational History  . Occupation: Music therapist at [employer]  . Financial resource strain: Not on file  . Food insecurity:    Worry: Not on file    Inability: Not on file  . Transportation needs:    Medical: Not on file    Non-medical: Not on file  Tobacco Use  . Smoking status: Former Smoker    Last attempt to quit: 05/13/2000    Years since quitting: 17.7  . Smokeless tobacco: Never Used  Substance and Sexual Activity  . Alcohol use: Yes    Comment: 12  pack per week (6 pack/day on weekends)  . Drug use: No  . Sexual activity: Yes    Partners: Female  Lifestyle  . Physical activity:    Days per week: Not on file    Minutes per session: Not on file  . Stress: Not on file  Relationships  . Social connections:    Talks on phone: Not on file    Gets together: Not on file    Attends religious service: Not on file    Active member of club or organization: Not on file    Attends meetings of clubs or organizations: Not on file    Relationship status: Not on file  . Intimate partner violence:    Fear of current or ex partner: Not on file     Emotionally abused: Not on file    Physically abused: Not on file    Forced sexual activity: Not on file  Other Topics Concern  . Not on file  Social History Narrative   Married, 2 children. 1 dog    Family History  Problem Relation Age of Onset  . Cancer Mother        breast--mid 16's  . Heart disease Father        CABG in early 48's; pacemaker  . Depression Father   . Cancer Father 29       prostate cancer  . Alcoholism Brother   . Heart disease Paternal Uncle        88's (died in 93's)  . Alzheimer's disease Maternal Grandmother   . Heart disease Paternal Grandfather        died in mid-40's from heart disease  . Diabetes Neg Hx     Outpatient Encounter Medications as of 01/29/2018  Medication Sig Note  . fish oil-omega-3 fatty acids 1000 MG capsule Take 2 g by mouth daily.   10/06/2013: Takes daily  . Multiple Vitamins-Minerals (MULTIVITAMIN WITH MINERALS) tablet Take 1 tablet by mouth daily.     Marland Kitchen SPIRULINA PO Take 1 tablet by mouth daily.   . Turmeric, Curcuma Longa, (CURCUMIN) POWD 1 capsule by Does not apply route daily.    No facility-administered encounter medications on file as of 01/29/2018.     No Known Allergies   ROS: The patient denies anorexia, fever, headaches, vision loss (needs reading glasses now), decreased hearing, ear pain, hoarseness, chest pain, palpitations, dizziness, syncope, dyspnea on exertion, cough, swelling, nausea, vomiting, diarrhea, constipation, abdominal pain, melena, hematochezia, indigestion/heartburn, hematuria, incontinence, erectile dysfunction, nocturia, weakened urine stream, dysuria, genital lesions, numbness, tingling, weakness, tremor, suspicious skin lesions, depression, abnormal bleeding/bruising, or enlarged lymph nodes. No further back or neck pain. Some urinary frequency, chronic and unchanged. Up 3/night to void. Drinks a lot of water. Some weaker stream only at night. Sees dermatologist regularly. Notes a "horn" on  his posterior scalp, left side. Not tender, only noticed in a mirror. Frequent stools, somewhat loose, since changing diet, stable. No blood in stools.    PHYSICAL EXAM:  BP 130/80   Pulse 68   Ht 5' 10.75" (1.797 m)   Wt 181 lb 3.2 oz (82.2 kg)   BMI 25.45 kg/m   Wt Readings from Last 3 Encounters:  01/29/18 181 lb 3.2 oz (82.2 kg)  01/23/17 181 lb 9.6 oz (82.4 kg)  01/22/16 180 lb 9.6 oz (81.9 kg)    General Appearance:  Alert, cooperative, no distress, appears stated age   Head:  Normocephalic, without obvious abnormality, atraumatic. Head is shaven.  There is a sebaceous cyst left posterior scalp, nontender   Eyes:  PERRL, conjunctiva/corneas clear, EOM's intact, fundi benign   Ears:  Normal TM's and external ear canals   Nose:  Nares normal, mucosa normal, no drainage or sinus tenderness   Throat:  Lips, mucosa, and tongue normal; teeth and gums normal.   Neck:  Supple, no lymphadenopathy; thyroid: no enlargement/ tenderness/nodules; no carotid bruit or JVD   Back:  Spine nontender, no curvature, ROM normal, no CVA tenderness.   Lungs:  Clear to auscultation bilaterally without wheezes, rales or ronchi; respirations unlabored   Chest Wall:  No tenderness or deformity   Heart:  Regular rate and rhythm, S1 and S2 normal, no murmur, rub or gallop   Breast Exam:  No chest wall tenderness, masses or gynecomastia   Abdomen:  Soft, non-tender, nondistended, normoactive bowel sounds, no masses, no hepatosplenomegaly   Genitalia:  Normal external genitalia without lesions. Testicles descended bilaterally, no masses.  No inguinal hernias  Rectal:  Normal sphincter tone, no mass, heme negative stool.  Prostate is just mildly enlarged for age, smooth, no nodules or asymmetry  Extremities:  No clubbing, cyanosis or edema   Pulses:  2+ and symmetric all extremities   Skin:  Skin color, texture, turgor normal, no rashes or lesions   Lymph nodes:  Cervical, supraclavicular, and axillary  nodes normal   Neurologic:  CNII-XII intact, normal strength, sensation and gait; reflexes 2+ and symmetric throughout.   Psych: Normal mood, affect, hygiene and grooming   ASSESSMENT/PLAN:  Annual physical exam - Plan: POCT Urinalysis DIP (Proadvantage Device), Comprehensive metabolic panel, Lipid panel, PSA  Family history of prostate cancer - Plan: PSA  Screening for prostate cancer - Plan: PSA  Need for influenza vaccination - Plan: Flu Vaccine QUAD 6+ mos PF IM (Fluarix Quad PF)  Family history of early CAD - Plan: Lipid panel  Engages in binge consumption of alcohol - counseled re: risks of excessive alcohol on weekends, encouraged cutting back, esp given FHx alcoholism   Glu, PSA, lipids.  Will check LFT's due to excessive alcohol (likely normal since no ETOH since weekend, today is Thurs).  Recommended at least 30 minutes of aerobic activity at least 5 days/week, weight bearing exercise at least 2x/week; proper sunscreen use reviewed; healthy diet and alcohol recommendations (less than or equal to 2 drinks/day--needs to cut back on weekend alcohol consumption) reviewed; regular seatbelt use; changing batteries in smoke detectors. Self-testicular exams. Immunization recommendations discussed--continue yearly flu shots. Colonoscopy recommendations reviewed--age 80, sooner if guidelines/coverage changes.  Risks/recommendations for PSA screening reviewed in detail.  F/u 1 year, sooner prn.

## 2018-01-29 ENCOUNTER — Ambulatory Visit (INDEPENDENT_AMBULATORY_CARE_PROVIDER_SITE_OTHER): Payer: 59 | Admitting: Family Medicine

## 2018-01-29 ENCOUNTER — Encounter: Payer: Self-pay | Admitting: Family Medicine

## 2018-01-29 VITALS — BP 130/80 | HR 68 | Ht 70.75 in | Wt 181.2 lb

## 2018-01-29 DIAGNOSIS — Z125 Encounter for screening for malignant neoplasm of prostate: Secondary | ICD-10-CM

## 2018-01-29 DIAGNOSIS — Z8042 Family history of malignant neoplasm of prostate: Secondary | ICD-10-CM

## 2018-01-29 DIAGNOSIS — Z8249 Family history of ischemic heart disease and other diseases of the circulatory system: Secondary | ICD-10-CM | POA: Diagnosis not present

## 2018-01-29 DIAGNOSIS — Z23 Encounter for immunization: Secondary | ICD-10-CM | POA: Diagnosis not present

## 2018-01-29 DIAGNOSIS — Z0000 Encounter for general adult medical examination without abnormal findings: Secondary | ICD-10-CM

## 2018-01-29 DIAGNOSIS — F1010 Alcohol abuse, uncomplicated: Secondary | ICD-10-CM

## 2018-01-29 LAB — POCT URINALYSIS DIP (PROADVANTAGE DEVICE)
BILIRUBIN UA: NEGATIVE
BILIRUBIN UA: NEGATIVE mg/dL
Blood, UA: NEGATIVE
Glucose, UA: NEGATIVE mg/dL
Leukocytes, UA: NEGATIVE
NITRITE UA: NEGATIVE
Protein Ur, POC: NEGATIVE mg/dL
SPECIFIC GRAVITY, URINE: 1.025
UUROB: NEGATIVE
pH, UA: 6 (ref 5.0–8.0)

## 2018-01-30 LAB — LIPID PANEL
CHOLESTEROL TOTAL: 217 mg/dL — AB (ref 100–199)
Chol/HDL Ratio: 3.9 ratio (ref 0.0–5.0)
HDL: 56 mg/dL (ref >39)
LDL CALC: 147 mg/dL — AB (ref 0–99)
Triglycerides: 70 mg/dL (ref 0–149)
VLDL Cholesterol Cal: 14 mg/dL (ref 5–40)

## 2018-01-30 LAB — COMPREHENSIVE METABOLIC PANEL
ALBUMIN: 5.1 g/dL (ref 3.5–5.5)
ALK PHOS: 51 IU/L (ref 39–117)
ALT: 34 IU/L (ref 0–44)
AST: 24 IU/L (ref 0–40)
Albumin/Globulin Ratio: 2.3 — ABNORMAL HIGH (ref 1.2–2.2)
BUN / CREAT RATIO: 13 (ref 9–20)
BUN: 12 mg/dL (ref 6–24)
Bilirubin Total: 0.6 mg/dL (ref 0.0–1.2)
CO2: 26 mmol/L (ref 20–29)
CREATININE: 0.95 mg/dL (ref 0.76–1.27)
Calcium: 9.5 mg/dL (ref 8.7–10.2)
Chloride: 103 mmol/L (ref 96–106)
GFR calc Af Amer: 111 mL/min/{1.73_m2} (ref >59)
GFR, EST NON AFRICAN AMERICAN: 96 mL/min/{1.73_m2} (ref >59)
GLOBULIN, TOTAL: 2.2 g/dL (ref 1.5–4.5)
Glucose: 99 mg/dL (ref 65–99)
Potassium: 4.4 mmol/L (ref 3.5–5.2)
SODIUM: 143 mmol/L (ref 134–144)
Total Protein: 7.3 g/dL (ref 6.0–8.5)

## 2018-01-30 LAB — PSA: PROSTATE SPECIFIC AG, SERUM: 0.5 ng/mL (ref 0.0–4.0)

## 2019-02-03 NOTE — Progress Notes (Signed)
Chief Complaint  Patient presents with  . Annual Exam    fasting annual exam. Has had runny stools. Has had some abdominal pains and is concerned about colon cancer-requested colonoscopy for peace of mind.     Kevin Lowe is a 48 y.o. male who presents for a complete physical.  He has the following concerns:  He is complaining of some loose stool and abdominal pain. He notes some urgency and loose stools after eating.  He mentioned this last year, but feels like it has gotten worse.  He goes 2-3 times in the morning, rather than just once.  He always has BM after lunch now (used to just have in the morning). No issues after dinner.  Denies constipation.    He cut out blue cheese, but otherwise didn't really make much changes as recommended last year. Does have some milk in coffee. Eats a lot of salads, vegetables.  No fried/greasy foods infrequent, every 2-3 weeks). Did have pizza this week. He has also been juicing a few times/week (beets, vegetables), doesn't notice a difference on those days. Eats a lot of raw vegetables.  He previously reported some chronic frequent urination, reports his prostate is enlarged. He saw urologist in the past (not following there). He drinks a lot of water, 2 cups coffee/d.  Stream is weaker at night, up 2-3x/night. Stream is fine during the day. No changes from last year.  H/o mild hyperlipidemia: Cholesterol had improved with dietary changes years ago.  He continues to follow a lowfat, low cholesterol diet. He eats healthy, and exercises regularly. He cut back on egg yolks since last year.  He also cut back on cheese. He had been eating 12-18 yolks/week. Now is eating that many eggs, but only 2-3 yolks/week. Lab Results  Component Value Date   CHOL 217 (H) 01/29/2018   HDL 56 01/29/2018   LDLCALC 147 (H) 01/29/2018   TRIG 70 01/29/2018   CHOLHDL 3.9 01/29/2018   He was noted to have elevated fasting glucose in 2017, sugar 108. Glucose and A1c were  normal in 2018, glu 99 last year.  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  This hasn't changed at all, and realizes he doesn't eat as well when drinking. Had been counseled about risks of binge drinking and health effects last year.  Denies any improvement.  Immunization History  Administered Date(s) Administered  . Influenza Split 02/19/2012  . Influenza Whole 01/16/2011  . Influenza,inj,Quad PF,6+ Mos 01/22/2016, 01/23/2017, 01/29/2018  . Tdap 01/25/2011   Last colonoscopy: never  Last PSA: 0.5 in 01/2018 Lab Results  Component Value Date   PSA 0.5 01/23/2017   PSA 0.51 10/06/2013  (had one done in 2017 also, at urologist) Dentist: twice yearly  Ophtho: 2 years ago (needs glasses). Exercise: irregular for the last 6 months--none for the first 2 months of COVID, but now getting more regular.  He is "active"--kayaks, walks the dog Prior to [place] his routine had been 3-4x/week high intensity interval training (cardio and weights)  Past Medical History:  Diagnosis Date  . Allergic rhinitis, cause unspecified   . Genital warts   . Hyperlipidemia   . Low back pain    resolved s/p surgery  . Ruptured spleen    and pancreas related to soccer injury 1992  . Urinary frequency     Past Surgical History:  Procedure Laterality Date  . CYSTOSCOPY  2007   normal--for urinary frequency  . LUMBAR LAMINECTOMY/DECOMPRESSION MICRODISCECTOMY  02/2012   L4-5, Dr. Vertell Limber  . LUMBAR LAMINECTOMY/DECOMPRESSION MICRODISCECTOMY  02/18/2012   Procedure: LUMBAR LAMINECTOMY/DECOMPRESSION MICRODISCECTOMY;  Surgeon: Erline Levine, MD;  Location: [facility];  Service: Neurosurgery;  Laterality: Right;  Right Lumbar four-five Microdiskectomy  . TONSILLECTOMY  child  . VARICOCELECTOMY  2005   L side    Social History   Socioeconomic History  . Marital status: Married    Spouse name: Lattie Haw  . Number of children: 2  . Years of education: Not on file  . Highest  education level: Not on file  Occupational History  . Occupation: Music therapist at [employer]  . Financial resource strain: Not on file  . Food insecurity    Worry: Not on file    Inability: Not on file  . Transportation needs    Medical: Not on file    Non-medical: Not on file  Tobacco Use  . Smoking status: Former Smoker    Quit date: 05/13/2000    Years since quitting: 18.7  . Smokeless tobacco: Never Used  Substance and Sexual Activity  . Alcohol use: Yes    Comment: 12 pack per week (6 pack/day on weekends)  . Drug use: No  . Sexual activity: Yes    Partners: Female  Lifestyle  . Physical activity    Days per week: Not on file    Minutes per session: Not on file  . Stress: Not on file  Relationships  . Social Herbalist on phone: Not on file    Gets together: Not on file    Attends religious service: Not on file    Active member of club or organization: Not on file    Attends meetings of clubs or organizations: Not on file    Relationship status: Not on file  . Intimate partner violence    Fear of current or ex partner: Not on file    Emotionally abused: Not on file    Physically abused: Not on file    Forced sexual activity: Not on file  Other Topics Concern  . Not on file  Social History Narrative   Married, 2 children. 1 dog    Family History  Problem Relation Age of Onset  . Cancer Mother        breast--mid 66's  . Heart disease Father        CABG in early 27's; pacemaker  . Depression Father   . Cancer Father 65       prostate cancer  . Alcoholism Brother   . Heart disease Paternal Uncle        21's (died in 87's)  . Alzheimer's disease Maternal Grandmother   . Heart disease Paternal Grandfather        died in mid-40's from heart disease  . Diabetes Neg Hx     Outpatient Encounter Medications as of 02/04/2019  Medication Sig Note  . fish oil-omega-3 fatty acids 1000 MG capsule Take 2 g by mouth daily.   10/06/2013: Takes  daily  . Multiple Vitamins-Minerals (MULTIVITAMIN WITH MINERALS) tablet Take 1 tablet by mouth daily.     Marland Kitchen SPIRULINA PO Take 1 tablet by mouth daily.   . Turmeric, Curcuma Longa, (CURCUMIN) POWD 1 capsule by Does not apply route daily.    No facility-administered encounter medications on file as of 02/04/2019.     No Known Allergies   ROS: The patient denies anorexia, fever, headaches, vision loss(needs reading glasses), decreased hearing, ear  pain, hoarseness, chest pain, palpitations, dizziness, syncope, dyspnea on exertion, cough, swelling, nausea, vomiting, diarrhea, constipation, abdominal pain, melena, hematochezia, indigestion/heartburn, hematuria, incontinence, erectile dysfunction, nocturia, weakened urine stream, dysuria, genital lesions, numbness, tingling, weakness, tremor, suspicious skin lesions, depression, abnormal bleeding/bruising, or enlarged lymph nodes. No further back or neck pain. Some urinary frequency, chronic and unchanged. Up2-3/night to void. Drinks a lot of water. Some weaker stream only at night. Sees dermatologist regularly. Cyst on back of head is unchanged, denies pain. Frequent stools, somewhat loose, per HPI. He is anxious about this.   PHYSICAL EXAM:  BP 130/88   Pulse 72   Temp (!) 96.9 F (36.1 C) (Other (Comment))   Ht 5\' 11"  (1.803 m)   Wt 188 lb 6.4 oz (85.5 kg)   BMI 26.28 kg/m   124/90 on repeat by MD  Wt Readings from Last 3 Encounters:  02/04/19 188 lb 6.4 oz (85.5 kg)  01/29/18 181 lb 3.2 oz (82.2 kg)  01/23/17 181 lb 9.6 oz (82.4 kg)   BP Readings from Last 3 Encounters:  02/04/19 130/88  01/29/18 130/80  01/23/17 114/80    General Appearance:  Alert, cooperative, no distress, appears stated age   Head:  Normocephalic, without obvious abnormality, atraumatic. Head is shaven. There is a sebaceous cyst left posterior scalp, nontender  Eyes:  PERRL, conjunctiva/corneas clear, EOM's intact, fundi benign   Ears:  Normal TM's  and external ear canals   Nose:  Not examined, wearing mask due to COVID-19 pandemic   Throat:  Not examined, wearing mask due to COVID-19 pandemic   Neck:  Supple, no lymphadenopathy; thyroid: no enlargement/ tenderness/nodules; no carotid bruit or JVD   Back:  Spine nontender, no curvature, ROM normal, no CVAtenderness.   Lungs:  Clear to auscultation bilaterally without wheezes, rales or ronchi; respirations unlabored   Chest Wall:  No tenderness or deformity   Heart:  Regular rate and rhythm, S1 and S2 normal, no murmur, rub or gallop   Breast Exam:  No chest wall tenderness, masses or gynecomastia   Abdomen:  Soft, non-tender, nondistended, normoactive bowel sounds, no masses, no hepatosplenomegaly   Genitalia: Normal external genitalia without lesions. Testicles descended bilaterally, no masses. No inguinal hernias  Rectal: Normal sphincter tone, no mass, heme negative stool. Prostate is just mildly enlarged for age, smooth, no nodules or asymmetry  Extremities:  No clubbing, cyanosis or edema   Pulses:  2+ and symmetric all extremities   Skin:  Skin color, texture, turgor normal, no rashes or lesions   Lymph nodes:  Cervical, supraclavicular, and axillary nodes normal   Neurologic:  CNII-XII intact, normal strength, sensation and gait; reflexes 2+ and symmetric throughout.   Psych: Normal mood, affect, hygiene and grooming   ASSESSMENT/PLAN:  Annual physical exam - Plan: Lipid panel, Comprehensive metabolic panel, CBC with Differential/Platelet, TSH, PSA  Family history of prostate cancer - Plan: PSA  Screening for prostate cancer - Plan: PSA  Need for influenza vaccination - Plan: Flu Vaccine QUAD 6+ mos PF IM (Fluarix Quad PF), CANCELED: Flu Vaccine QUAD High Dose(Fluad)  Family history of early CAD - Plan: Lipid panel  Engages in binge consumption of alcohol - risks reviewed, counseled again on alcohol recommendations and to cut back. Now poss affecting  BP too, and gained wt - Plan: Comprehensive metabolic panel  Pure hypercholesterolemia - recheck; cut out lots of egg yolks. Reviewed low cholesterol diet - Plan: Lipid panel  Change in bowel habits - somewhat worse from  last year. Again encouraged probiotics, dietary trials, cut back on alcohol, high fiber diet - Plan: Comprehensive metabolic panel, CBC with Differential/Platelet, TSH, Celiac Panel  Diarrhea, unspecified type - Plan: Celiac Panel  Colon cancer screening - recommended at this age even without symptoms, as routine screening. Reassured that his sx are not likely related to cancer - Plan: Ambulatory referral to Gastroenterology  Borderline high blood pressure - (diastolic); counseled re: daily exercise, low Na diet, cut back on alcohol   Recommended at least 30 minutes of aerobic activity at least 5 days/week, weight bearing exercise at least 2x/week; proper sunscreen use reviewed; healthy diet and alcohol recommendations (less than or equal to 2 drinks/day--needs to cut back on weekend alcohol consumption) reviewed; regular seatbelt use; changing batteries in smoke detectors. Self-testicular exams. Immunization recommendations discussed--continue yearly flu shots, given today. Colonoscopy recommendations reviewed--due now, if covered by insurance for screening.  Can consider for bowel concerns (not screening), but recommend he try some of the suggested measures first (probiotics, dietary changes, etc). Risks/recommendations for PSA screening reviewed in detail.  F/u 1 year, sooner prn.

## 2019-02-03 NOTE — Patient Instructions (Addendum)
HEALTH MAINTENANCE RECOMMENDATIONS:  It is recommended that you get at least 30 minutes of aerobic exercise at least 5 days/week (for weight loss, you may need as much as 60-90 minutes). This can be any activity that gets your heart rate up. This can be divided in 10-15 minute intervals if needed, but try and build up your endurance at least once a week.  Weight bearing exercise is also recommended twice weekly.  Eat a healthy diet with lots of vegetables, fruits and fiber.  "Colorful" foods have a lot of vitamins (ie green vegetables, tomatoes, red peppers, etc).  Limit sweet tea, regular sodas and alcoholic beverages, all of which has a lot of calories and sugar.  Up to 2 alcoholic drinks daily may be beneficial for men (unless trying to lose weight, watch sugars).  Drink a lot of water.  Sunscreen of at least SPF 30 should be used on all sun-exposed parts of the skin when outside between the hours of 10 am and 4 pm (not just when at beach or pool, but even with exercise, golf, tennis, and yard work!)  Use a sunscreen that says "broad spectrum" so it covers both UVA and UVB rays, and make sure to reapply every 1-2 hours.  Remember to change the batteries in your smoke detectors when changing your clock times in the spring and fall. Carbon monoxide detectors are recommended for your home.  Use your seat belt every time you are in a car, and please drive safely and not be distracted with cell phones and texting while driving.  Please cut back on your alcohol. Your blood pressure was a little elevated (the diastolic was [id number]).  Periodically check your blood pressure elsewhere. If it remains 130/80 or higher regularly, please let us know.  Getting back to regular exercise, losing the weight and cutting back on alcohol should help keep the blood pressure down (as well as low sodium diet).  Please try taking a probiotic (Align or other) daily for the next 4 weeks to see if this helps. You may also do  a 2 week trial of lactose-free diet (no dairy) to see if that helps (and if it does, and symptoms recur when you reintroduce dairy, you may want to switch to lactose-free products (Lactaid milk or almondmilk or soy milk).  If this doesn't work, you can consider trying a gluten-free diet (even if your tests do not show any celiac disease, as some people are just sensitive to the gluten, and feel better on a gluten-free diet).  You want to eat a high fiber diet (juicing takes the fiber out).  You may be having some symptoms related to the raw vegetables and greens in your diet--you may want to try a week with a different diet to see if it improve. These can be very gas-producing.  You can also try taking a Beano prior to eating these foods to see if it makes a difference.   Lactose-Free Diet, Adult If you have lactose intolerance, you are not able to digest lactose. Lactose is a natural sugar found mainly in dairy milk and dairy products. You may need to avoid all foods and beverages that contain lactose. A lactose-free diet can help you do this. Which foods have lactose? Lactose is found in dairy milk and dairy products, such as:  Yogurt.  Cheese.  Butter.  Margarine.  Sour cream.  Cream.  Whipped toppings and nondairy creamers.  Ice cream and other dairy-based desserts. Lactose is also found  in foods or products made with dairy milk or milk ingredients. To find out whether a food contains dairy milk or a milk ingredient, look at the ingredients list. Avoid foods with the statement "May contain milk" and foods that contain:  Milk powder.  Whey.  Curd.  Caseinate.  Lactose.  Lactalbumin.  Lactoglobulin. What are alternatives to dairy milk and foods made with milk products?  Lactose-free milk.  Soy milk with added calcium and vitamin D.  Almond milk, coconut milk, rice milk, or other nondairy milk alternatives with added calcium and vitamin D. Note that these are low in  protein.  Soy products, such as soy yogurt, soy cheese, soy ice cream, and soy-based sour cream.  Other nut milk products, such as almond yogurt, almond cheese, cashew yogurt, cashew cheese, cashew ice cream, coconut yogurt, and coconut ice cream. What are tips for following this plan?  Do not consume foods, beverages, vitamins, minerals, or medicines containing lactose. Read ingredient lists carefully.  Look for the words "lactose-free" on labels.  Use lactase enzyme drops or tablets as directed by your health care provider.  Use lactose-free milk or a milk alternative, such as soy milk or almond milk, for drinking and cooking.  Make sure you get enough calcium and vitamin D in your diet. A lactose-free eating plan can be lacking in these important nutrients.  Take calcium and vitamin D supplements as directed by your health care provider. Talk to your health care provider about supplements if you are not able to get enough calcium and vitamin D from food. What foods can I eat?  Fruits All fresh, canned, frozen, or dried fruits that are not processed with lactose. Vegetables All fresh, frozen, and canned vegetables without cheese, cream, or butter sauces. Grains Any that are not made with dairy milk or dairy products. Meats and other proteins Any meat, fish, poultry, and other protein sources that are not made with dairy milk or dairy products. Soy cheese and yogurt. Fats and oils Any that are not made with dairy milk or dairy products. Beverages Lactose-free milk. Soy, rice, or almond milk with added calcium and vitamin D. Fruit and vegetable juices. Sweets and desserts Any that are not made with dairy milk or dairy products. Seasonings and condiments Any that are not made with dairy milk or dairy products. Calcium Calcium is found in many foods that contain lactose and is important for bone health. The amount of calcium you need depends on your age:  Adults younger than 50  years: 1,000 mg of calcium a day.  Adults older than 50 years: 1,200 mg of calcium a day. If you are not getting enough calcium, you may get it from other sources, including:  Orange juice with calcium added. There are 300-350 mg of calcium in 1 cup of orange juice.  Calcium-fortified soy milk. There are 300-400 mg of calcium in 1 cup of calcium-fortified soy milk.  Calcium-fortified rice or almond milk. There are 300 mg of calcium in 1 cup of calcium-fortified rice or almond milk.  Calcium-fortified breakfast cereals. There are 100-1,000 mg of calcium in calcium-fortified breakfast cereals.  Spinach, cooked. There are 145 mg of calcium in  cup of cooked spinach.  Edamame, cooked. There are 130 mg of calcium in  cup of cooked edamame.  Collard greens, cooked. There are 125 mg of calcium in  cup of cooked collard greens.  Kale, frozen or cooked. There are 90 mg of calcium in  cup of cooked  or frozen kale.  Almonds. There are 95 mg of calcium in  cup of almonds.  Broccoli, cooked. There are 60 mg of calcium in 1 cup of cooked broccoli. The items listed above may not be a complete list of recommended foods and beverages. Contact a dietitian for more options. What foods are not recommended? Fruits None, unless they are made with dairy milk or dairy products. Vegetables None, unless they are made with dairy milk or dairy products. Grains Any grains that are made with dairy milk or dairy products. Meats and other proteins None, unless they are made with dairy milk or dairy products. Dairy All dairy products, including milk, goat's milk, buttermilk, kefir, acidophilus milk, flavored milk, evaporated milk, condensed milk, dulce de [place], eggnog, yogurt, cheese, and cheese spreads. Fats and oils Any that are made with milk or milk products. Margarines and salad dressings that contain milk or cheese. Cream. Half and half. Cream cheese. Sour cream. Chip dips made with sour cream or  yogurt. Beverages Hot chocolate. Cocoa with lactose. Instant iced teas. Powdered fruit drinks. Smoothies made with dairy milk or yogurt. Sweets and desserts Any that are made with milk or milk products. Seasonings and condiments Chewing gum that has lactose. Spice blends if they contain lactose. Artificial sweeteners that contain lactose. Nondairy creamers. The items listed above may not be a complete list of foods and beverages to avoid. Contact a dietitian for more information. Summary  If you are lactose intolerant, it means that you have a hard time digesting lactose, a natural sugar found in milk and milk products.  Following a lactose-free diet can help you manage this condition.  Calcium is important for bone health and is found in many foods that contain lactose. Talk with your health care provider about other sources of calcium. This information is not intended to replace advice given to you by your health care provider. Make sure you discuss any questions you have with your health care provider. Document Released: 10/19/2001 Document Revised: 05/27/2017 Document Reviewed: 05/27/2017 Elsevier Patient Education  [place].   Gluten-Free Diet for Celiac Disease, Adult  The gluten-free diet includes all foods that do not contain gluten. Gluten is a protein that is found in wheat, rye, barley, and some other grains. Following the gluten-free diet is the only treatment for people with celiac disease. It helps to prevent damage to the intestines and improves or eliminates the symptoms of celiac disease. Following the gluten-free diet requires some planning. It can be challenging at first, but it gets easier with time and practice. There are more gluten-free options available today than ever before. If you need help finding gluten-free foods or if you have questions, talk with your diet and nutrition specialist (registered dietitian) or your health care provider. What do I need to  know about a gluten-free diet?  All fruits, vegetables, and meats are safe to eat and do not contain gluten.  When grocery shopping, start by shopping in the produce, meat, and dairy sections. These sections are more likely to contain gluten-free foods. Then move to the aisles that contain packaged foods if you need to.  Read all food labels. Gluten is often added to foods. Always check the ingredient list and look for warnings, such as may contain gluten."  Talk with your dietitian or health care provider before taking a gluten-free multivitamin or mineral supplement.  Be aware of gluten-free foods having contact with foods that contain gluten (cross-contamination). This can happen at  home and with any processed foods. ? Talk with your health care provider or dietitian about how to reduce the risk of cross-contamination in your home. ? If you have questions about how a food is processed, ask the manufacturer. What key words help to identify gluten? Foods that list any of these key words on the label usually contain gluten:  Wheat, flour, enriched flour, bromated flour, white flour, durum flour, graham flour, phosphated flour, self-rising flour, semolina, farina, barley (malt), rye, and oats.  Starch, dextrin, modified food starch, or cereal.  Thickening, fillers, or emulsifiers.  Malt flavoring, malt extract, or malt syrup.  Hydrolyzed vegetable protein. In the [place] packaged foods that are gluten-free are required to be labeled GF. These foods should be easy to identify and are safe to eat. In the [place] food companies are also required to list common food allergens, including wheat, on their labels. Recommended foods Grains  Amaranth, bean flours, 100% buckwheat flour, corn, millet, nut flours or nut meals, GF oats, quinoa, rice, sorghum, teff, rice wafers, pure cornmeal tortillas, popcorn, and hot cereals made from cornmeal. Hominy, rice, wild rice. Some Asian rice noodles or bean  noodles. Arrowroot starch, corn bran, corn flour, corn germ, cornmeal, corn starch, potato flour, potato starch flour, and rice bran. Plain, brown, and sweet rice flours. Rice polish, soy flour, and tapioca starch. Vegetables  All plain fresh, frozen, and canned vegetables. Fruits  All plain fresh, frozen, canned, and dried fruits, and 100% fruit juices. Meats and other protein foods  All fresh beef, pork, poultry, fish, seafood, and eggs. Fish canned in water, oil, brine, or vegetable broth. Plain nuts and seeds, peanut butter. Some lunch meat and some frankfurters. Dried beans, dried peas, and lentils. Dairy  Fresh plain, dry, evaporated, or condensed milk. Cream, butter, sour cream, whipping cream, and most yogurts. Unprocessed cheese, most processed cheeses, some cottage cheese, some cream cheeses. Beverages  Coffee, tea, most herbal teas. Carbonated beverages and some root beers. Wine, sake, and distilled spirits, such as gin, vodka, and whiskey. Most hard ciders. Fats and oils  Butter, margarine, vegetable oil, hydrogenated butter, olive oil, shortening, lard, cream, and some mayonnaise. Some commercial salad dressings. Olives. Sweets and desserts  Sugar, honey, some syrups, molasses, jelly, and jam. Plain hard candy, marshmallows, and gumdrops. Pure cocoa powder. Plain chocolate. Custard and some pudding mixes. Gelatin desserts, sorbets, frozen ice pops, and sherbet. Cake, cookies, and other desserts prepared with allowed flours. Some commercial ice creams. Cornstarch, tapioca, and rice puddings. Seasoning and other foods  Some canned or frozen soups. Monosodium glutamate (MSG). Cider, rice, and wine vinegar. Baking soda and baking powder. Cream of tartar. Baking and nutritional yeast. Certain soy sauces made without wheat (ask your dietitian about specific brands that are allowed). Nuts, coconut, and chocolate. Salt, pepper, herbs, spices, flavoring extracts, imitation or artificial  flavorings, natural flavorings, and food colorings. Some medicines and supplements. Some lip glosses and other cosmetics. Rice syrups. The items listed may not be a complete list. Talk with your dietitian about what dietary choices are best for you. Foods to avoid Grains  Barley, bran, bulgur, couscous, cracked wheat, [place], farro, graham, malt, matzo, semolina, wheat germ, and all wheat and rye cereals including spelt and kamut. Cereals containing malt as a flavoring, such as rice cereal. Noodles, spaghetti, macaroni, most packaged rice mixes, and all mixes containing wheat, rye, barley, or triticale. Vegetables  Most creamed vegetables and most vegetables canned in sauces. Some commercially prepared vegetables and  salads. Fruits  Thickened or prepared fruits and some pie fillings. Some fruit snacks and fruit roll-ups. Meats and other protein foods  Any meat or meat alternative containing wheat, rye, barley, or gluten stabilizers. These are often marinated or packaged meats and lunch meats. Bread-containing products, such as Swiss steak, croquettes, meatballs, and meatloaf. Most tuna canned in vegetable broth and [place] with hydrolyzed vegetable protein (HVP) injected as part of the basting. [place]. Imitation fish. Eggs in sauces made from ingredients to avoid. Dairy  Commercial chocolate milk drinks and malted milk. Some non-dairy creamers. Any cheese product containing ingredients to avoid. Beverages  Certain cereal beverages. Beer, ale, malted milk, and some root beers. Some hard ciders. Some instant flavored coffees. Some herbal teas made with barley or with barley malt added. Fats and oils  Some commercial salad dressings. Sour cream containing modified food starch. Sweets and desserts  Some toffees. Chocolate-coated nuts (may be rolled in wheat flour) and some commercial candies and candy bars. Most cakes, cookies, donuts, pastries, and other baked goods. Some commercial ice cream.  Ice cream cones. Commercially prepared mixes for cakes, cookies, and other desserts. Bread pudding and other puddings thickened with flour. Products containing brown rice syrup made with barley malt enzyme. Desserts and sweets made with malt flavoring. Seasoning and other foods  Some curry powders, some dry seasoning mixes, some gravy extracts, some meat sauces, some ketchups, some prepared mustards, and horseradish. Certain soy sauces. Malt vinegar. Bouillon and bouillon cubes that contain HVP. Some chip dips, and some chewing gum. Yeast extract. Brewers yeast. Caramel color. Some medicines and supplements. Some lip glosses and other cosmetics. The items listed may not be a complete list. Talk with your dietitian about what dietary choices are best for you. Summary  Gluten is a protein that is found in wheat, rye, barley, and some other grains. The gluten-free diet includes all foods that do not contain gluten.  If you need help finding gluten-free foods or if you have questions, talk with your diet and nutrition specialist (registered dietitian) or your health care provider.  Read all food labels. Gluten is often added to foods. Always check the ingredient list and look for warnings, such as may contain gluten." This information is not intended to replace advice given to you by your health care provider. Make sure you discuss any questions you have with your health care provider. Document Released: 04/29/2005 Document Revised: 04/11/2017 Document Reviewed: 02/12/2016 Elsevier Patient Education  2020 [employer].

## 2019-02-04 ENCOUNTER — Ambulatory Visit (INDEPENDENT_AMBULATORY_CARE_PROVIDER_SITE_OTHER): Payer: 59 | Admitting: Family Medicine

## 2019-02-04 ENCOUNTER — Encounter: Payer: Self-pay | Admitting: Family Medicine

## 2019-02-04 ENCOUNTER — Other Ambulatory Visit: Payer: Self-pay

## 2019-02-04 VITALS — BP 130/88 | HR 72 | Temp 96.9°F | Ht 71.0 in | Wt 188.4 lb

## 2019-02-04 DIAGNOSIS — E7800 Pure hypercholesterolemia, unspecified: Secondary | ICD-10-CM | POA: Diagnosis not present

## 2019-02-04 DIAGNOSIS — Z0000 Encounter for general adult medical examination without abnormal findings: Secondary | ICD-10-CM

## 2019-02-04 DIAGNOSIS — Z1211 Encounter for screening for malignant neoplasm of colon: Secondary | ICD-10-CM

## 2019-02-04 DIAGNOSIS — Z8249 Family history of ischemic heart disease and other diseases of the circulatory system: Secondary | ICD-10-CM

## 2019-02-04 DIAGNOSIS — R194 Change in bowel habit: Secondary | ICD-10-CM

## 2019-02-04 DIAGNOSIS — R197 Diarrhea, unspecified: Secondary | ICD-10-CM

## 2019-02-04 DIAGNOSIS — Z23 Encounter for immunization: Secondary | ICD-10-CM | POA: Diagnosis not present

## 2019-02-04 DIAGNOSIS — F1010 Alcohol abuse, uncomplicated: Secondary | ICD-10-CM | POA: Diagnosis not present

## 2019-02-04 DIAGNOSIS — R030 Elevated blood-pressure reading, without diagnosis of hypertension: Secondary | ICD-10-CM

## 2019-02-04 DIAGNOSIS — Z125 Encounter for screening for malignant neoplasm of prostate: Secondary | ICD-10-CM | POA: Diagnosis not present

## 2019-02-04 DIAGNOSIS — Z8042 Family history of malignant neoplasm of prostate: Secondary | ICD-10-CM | POA: Diagnosis not present

## 2019-02-05 ENCOUNTER — Encounter: Payer: Self-pay | Admitting: Gastroenterology

## 2019-02-05 LAB — CBC WITH DIFFERENTIAL/PLATELET
Basophils Absolute: 0 10*3/uL (ref 0.0–0.2)
Basos: 0 %
EOS (ABSOLUTE): 0.1 10*3/uL (ref 0.0–0.4)
Eos: 2 %
Hematocrit: 47.4 % (ref 37.5–51.0)
Hemoglobin: 16.3 g/dL (ref 13.0–17.7)
Immature Grans (Abs): 0 10*3/uL (ref 0.0–0.1)
Immature Granulocytes: 0 %
Lymphocytes Absolute: 1.4 10*3/uL (ref 0.7–3.1)
Lymphs: 27 %
MCH: 31 pg (ref 26.6–33.0)
MCHC: 34.4 g/dL (ref 31.5–35.7)
MCV: 90 fL (ref 79–97)
Monocytes Absolute: 0.5 10*3/uL (ref 0.1–0.9)
Monocytes: 9 %
Neutrophils Absolute: 3.3 10*3/uL (ref 1.4–7.0)
Neutrophils: 62 %
Platelets: 226 10*3/uL (ref 150–450)
RBC: 5.25 x10E6/uL (ref 4.14–5.80)
RDW: 12 % (ref 11.6–15.4)
WBC: 5.3 10*3/uL (ref 3.4–10.8)

## 2019-02-05 LAB — GLIA (IGA/G) + TTG IGA
Antigliadin Abs, IgA: 2 units (ref 0–19)
Gliadin IgG: 3 units (ref 0–19)
Transglutaminase IgA: <2 U/mL (ref 0–3)

## 2019-02-05 LAB — COMPREHENSIVE METABOLIC PANEL
ALT: 39 IU/L (ref 0–44)
AST: 27 IU/L (ref 0–40)
Albumin/Globulin Ratio: 2 (ref 1.2–2.2)
Albumin: 4.9 g/dL (ref 4.0–5.0)
Alkaline Phosphatase: 57 IU/L (ref 39–117)
BUN/Creatinine Ratio: 16 (ref 9–20)
BUN: 15 mg/dL (ref 6–24)
Bilirubin Total: 0.6 mg/dL (ref 0.0–1.2)
CO2: 25 mmol/L (ref 20–29)
Calcium: 9.7 mg/dL (ref 8.7–10.2)
Chloride: 100 mmol/L (ref 96–106)
Creatinine, Ser: 0.96 mg/dL (ref 0.76–1.27)
GFR calc Af Amer: 109 mL/min/{1.73_m2} (ref >59)
GFR calc non Af Amer: 94 mL/min/{1.73_m2} (ref >59)
Globulin, Total: 2.4 g/dL (ref 1.5–4.5)
Glucose: 99 mg/dL (ref 65–99)
Potassium: 4.5 mmol/L (ref 3.5–5.2)
Sodium: 138 mmol/L (ref 134–144)
Total Protein: 7.3 g/dL (ref 6.0–8.5)

## 2019-02-05 LAB — PSA: Prostate Specific Ag, Serum: 0.5 ng/mL (ref 0.0–4.0)

## 2019-02-05 LAB — LIPID PANEL
Chol/HDL Ratio: 4.1 ratio (ref 0.0–5.0)
Cholesterol, Total: 223 mg/dL — ABNORMAL HIGH (ref 100–199)
HDL: 54 mg/dL (ref >39)
LDL Chol Calc (NIH): 153 mg/dL — ABNORMAL HIGH (ref 0–99)
Triglycerides: 90 mg/dL (ref 0–149)
VLDL Cholesterol Cal: 16 mg/dL (ref 5–40)

## 2019-02-05 LAB — TSH: TSH: 1.15 u[IU]/mL (ref 0.450–4.500)

## 2019-03-11 ENCOUNTER — Other Ambulatory Visit: Payer: Self-pay

## 2019-03-11 ENCOUNTER — Ambulatory Visit: Payer: 59 | Admitting: Gastroenterology

## 2019-03-11 ENCOUNTER — Encounter: Payer: Self-pay | Admitting: Gastroenterology

## 2019-03-11 VITALS — BP 118/80 | HR 64 | Temp 98.5°F | Ht 71.0 in | Wt 186.0 lb

## 2019-03-11 DIAGNOSIS — Z1159 Encounter for screening for other viral diseases: Secondary | ICD-10-CM

## 2019-03-11 DIAGNOSIS — R109 Unspecified abdominal pain: Secondary | ICD-10-CM

## 2019-03-11 DIAGNOSIS — R194 Change in bowel habit: Secondary | ICD-10-CM

## 2019-03-11 MED ORDER — SUPREP BOWEL PREP KIT 17.5-3.13-1.6 GM/177ML PO SOLN: ORAL | 0 refills | Status: DC

## 2019-03-11 NOTE — Progress Notes (Signed)
HPI :  48 year old male with a history of splenic injury, lumbar back surgery, referred by Dr. Rita Lowe for bowel habit changes.  The patient states he has had a change in bowel habits over the past 6 to 12 months.  His normal baseline was 1 bowel movement per day, now he is having roughly 3 bowel movements per day which are often loose.  He also has some urgency with his bowels particularly after he eats something he is sensitive to.  He denies any blood in his stools, he denies any weight loss.  In fact since the Covid outbreak he has gained about 7 pounds.  He is generally eating pretty well, tries to eat healthy.  Denies any reflux, dysphagia, postprandial pain.  He does have some mid abdominal discomfort which he describes as a cramping/mild discomfort that comes and goes.  This discomfort can often be relieved with a bowel movement.  He denies any new changes to his medications.  He does not take any prescription medications.  He denies any NSAIDs.  He has had prior tobacco use but has since quit.  He drinks about a 12 pack of beer per week which is been stable over time.  He does not have any known history of liver disease.  He had relatively recent labs in September without any anemia or elevation of white blood cell count.  He had a screening test for celiac disease with serology which was negative, although total IgA level not obtained.  He has not tried much to treat his symptoms yet.  He denies any family history of colon cancer, no history of inflammatory bowel disease in his family that he is aware of.  He's never had a prior colonoscopy.    Past Medical History:  Diagnosis Date  . Allergic rhinitis, cause unspecified   . Genital warts   . Hyperlipidemia   . Low back pain    resolved s/p surgery  . Ruptured spleen    and pancreas related to soccer injury 1992  . Urinary frequency      Past Surgical History:  Procedure Laterality Date  . CYSTOSCOPY  2007   normal--for  urinary frequency  . LUMBAR LAMINECTOMY/DECOMPRESSION MICRODISCECTOMY  02/2012   L4-5, Dr. Vertell Lowe  . LUMBAR LAMINECTOMY/DECOMPRESSION MICRODISCECTOMY  02/18/2012   Procedure: LUMBAR LAMINECTOMY/DECOMPRESSION MICRODISCECTOMY;  Surgeon: Kevin Levine, MD;  Location: [facility];  Service: Neurosurgery;  Laterality: Right;  Right Lumbar four-five Microdiskectomy  . TONSILLECTOMY  child  . VARICOCELECTOMY  2005   L side   Family History  Problem Relation Age of Onset  . Cancer Mother        breast--mid 1's  . Heart disease Father        CABG in early 57's; pacemaker  . Depression Father   . Cancer Father 73       prostate cancer  . Alcoholism Brother   . Heart disease Paternal Uncle        22's (died in 63's)  . Alzheimer's disease Maternal Grandmother   . Heart disease Paternal Grandfather        died in mid-40's from heart disease  . Diabetes Neg Hx    Social History   Tobacco Use  . Smoking status: Former Smoker    Quit date: 05/13/2000    Years since quitting: 18.8  . Smokeless tobacco: Never Used  Substance Use Topics  . Alcohol use: Yes    Comment: 12 pack per week (6 pack/day  on weekends)  . Drug use: No   Current Outpatient Medications  Medication Sig Dispense Refill  . fish oil-omega-3 fatty acids 1000 MG capsule Take 2 g by mouth daily.      . Multiple Vitamins-Minerals (MULTIVITAMIN WITH MINERALS) tablet Take 1 tablet by mouth daily.      Marland Kitchen SPIRULINA PO Take 1 tablet by mouth daily.    . Turmeric, Curcuma Longa, (CURCUMIN) POWD 1 capsule by Does not apply route daily.     No current facility-administered medications for this visit.    No Known Allergies   Review of Systems: All systems reviewed and negative except where noted in HPI.   Lab Results  Component Value Date   WBC 5.3 02/04/2019   HGB 16.3 02/04/2019   HCT 47.4 02/04/2019   MCV 90 02/04/2019   PLT 226 02/04/2019    Lab Results  Component Value Date   CREATININE 0.96 02/04/2019   BUN 15  02/04/2019   NA 138 02/04/2019   K 4.5 02/04/2019   CL 100 02/04/2019   CO2 25 02/04/2019    Lab Results  Component Value Date   ALT 39 02/04/2019   AST 27 02/04/2019   ALKPHOS 57 02/04/2019   BILITOT 0.6 02/04/2019     Physical Exam: BP 118/80 (BP Location: Left Arm, Patient Position: Sitting, Cuff Size: Normal)   Pulse 64   Temp 98.5 F (36.9 C) (Oral)   Ht 5\' 11"  (1.803 m)   Wt 186 lb (84.4 kg)   BMI 25.94 kg/m  Constitutional: Pleasant,well-developed, male in no acute distress. HEENT: Normocephalic and atraumatic. Conjunctivae are normal. No scleral icterus. Neck supple.  Cardiovascular: Normal rate, regular rhythm.  Pulmonary/chest: Effort normal and breath sounds normal. No wheezing, rales or rhonchi. Abdominal: Soft, nondistended, nontender.  There are no masses palpable. No hepatomegaly. Extremities: no edema Lymphadenopathy: No cervical adenopathy noted. Neurological: Alert and oriented to person place and time. Skin: Skin is warm and dry. No rashes noted. Psychiatric: Normal mood and affect. Behavior is normal.   ASSESSMENT AND PLAN: 48 year old male here for new patient assessment the following:  Bowel habit changes / Abdominal discomfort - new symptoms over the past 6 to 12 months.  He has no anemia, no blood in the stools or other alarm symptoms.  Discussed differential diagnosis, unsure if he is having a functional change/food intolerance, although given his age and the new screening guidelines, colonoscopy is recommended to screen for polyps/mass lesions, rule out inflammatory disorders, etc.  I discussed colonoscopy with him, risks and benefits of the exam and anesthesia, and following this discussion he wanted to proceed.  While we await that result, I counseled him on a low FODMAP diet and gave him handouts about this to see if that may help minimize some of his symptoms in the interim.  Otherwise recommend a trial of a probiotic over-the-counter, such as  Florastor, will see if that provides any benefit.  I offered him a trial of Bentyl for cramps which he declined at this time.  Further recommendations pending results of his colonoscopy and his course.  All questions answered.  Fallon Cellar, MD [facility]  CC: Kevin Ohara, MD

## 2019-03-11 NOTE — Patient Instructions (Addendum)
If you are age 46 or older, your body mass index should be between 23-30. Your Body mass index is 25.94 kg/m. If this is out of the aforementioned range listed, please consider follow up with your Primary Care Provider.  If you are age 64 or younger, your body mass index should be between 19-25. Your Body mass index is 25.94 kg/m. If this is out of the aformentioned range listed, please consider follow up with your Primary Care Provider.   To help prevent the possible spread of infection to our patients, communities, and staff; we will be implementing the following measures:  As of now we are not allowing any visitors/family members to accompany you to any upcoming appointments with [facility]. If you have any concerns about this please contact our office to discuss prior to the appointment.   We are giving you a Low FOD-MAP diet to follow today.  You should consider taking a daily probiotic such as Florastor.  You have been scheduled for a colonoscopy. Please follow written instructions given to you at your visit today.  Please pick up your prep supplies at the pharmacy within the next 1-3 days. If you use inhalers (even only as needed), please bring them with you on the day of your procedure. Your physician has requested that you go to www.startemmi.com and enter the access code given to you at your visit today. This web site gives a general overview about your procedure. However, you should still follow specific instructions given to you by our office regarding your preparation for the procedure.   Thank you for entrusting me with your care and for choosing [facility], Dr. Aldora Cellar

## 2019-04-06 ENCOUNTER — Encounter: Payer: Self-pay | Admitting: Gastroenterology

## 2019-04-16 ENCOUNTER — Other Ambulatory Visit: Payer: Self-pay | Admitting: Gastroenterology

## 2019-04-16 ENCOUNTER — Ambulatory Visit (INDEPENDENT_AMBULATORY_CARE_PROVIDER_SITE_OTHER): Payer: 59

## 2019-04-16 DIAGNOSIS — Z1159 Encounter for screening for other viral diseases: Secondary | ICD-10-CM

## 2019-04-16 LAB — SARS CORONAVIRUS 2 (TAT 6-24 HRS): SARS Coronavirus 2: NEGATIVE

## 2019-04-20 ENCOUNTER — Other Ambulatory Visit: Payer: Self-pay

## 2019-04-20 ENCOUNTER — Encounter: Payer: Self-pay | Admitting: Gastroenterology

## 2019-04-20 ENCOUNTER — Ambulatory Visit (AMBULATORY_SURGERY_CENTER): Payer: 59 | Admitting: Gastroenterology

## 2019-04-20 VITALS — BP 129/81 | HR 68 | Temp 98.3°F | Resp 13 | Ht 71.0 in | Wt 186.0 lb

## 2019-04-20 DIAGNOSIS — K648 Other hemorrhoids: Secondary | ICD-10-CM

## 2019-04-20 DIAGNOSIS — R194 Change in bowel habit: Secondary | ICD-10-CM

## 2019-04-20 MED ORDER — SODIUM CHLORIDE 0.9 % IV SOLN: 500.0000 mL | Freq: Once | INTRAVENOUS | Status: DC

## 2019-04-20 NOTE — Op Note (Signed)
[facility] Patient Name: Kevin Lowe Procedure Date: 04/20/2019 11:59 AM MRN: [id number] Endoscopist: Viviann Spare P. Adela Lank , MD Age: 48 Referring MD:  Date of Birth: [birth year] Gender: Male Account #: [id number] Procedure:                Colonoscopy Indications:              Change in bowel habits - improved with probiotic                            since office visit Medicines:                Monitored Anesthesia Care Procedure:                Pre-Anesthesia Assessment:                           - Prior to the procedure, a History and Physical                            was performed, and patient medications and                            allergies were reviewed. The patient's tolerance of                            previous anesthesia was also reviewed. The risks                            and benefits of the procedure and the sedation                            options and risks were discussed with the patient.                            All questions were answered, and informed consent                            was obtained. Prior Anticoagulants: The patient has                            taken no previous anticoagulant or antiplatelet                            agents. ASA Grade Assessment: II - A patient with                            mild systemic disease. After reviewing the risks                            and benefits, the patient was deemed in                            satisfactory condition to undergo the procedure.  After obtaining informed consent, the colonoscope                            was passed under direct vision. Throughout the                            procedure, the patient's blood pressure, pulse, and                            oxygen saturations were monitored continuously. The                            Colonoscope was introduced through the anus and                            advanced to the the terminal  ileum, with                            identification of the appendiceal orifice and IC                            valve. The colonoscopy was performed without                            difficulty. The patient tolerated the procedure                            well. The quality of the bowel preparation was                            adequate. The terminal ileum, ileocecal valve,                            appendiceal orifice, and rectum were photographed. Scope In: 12:04:47 PM Scope Out: 12:17:52 PM Scope Withdrawal Time: 0 hours 11 minutes 37 seconds  Total Procedure Duration: 0 hours 13 minutes 5 seconds  Findings:                 The perianal and digital rectal examinations were                            normal.                           The terminal ileum appeared normal.                           Internal hemorrhoids were found during retroflexion.                           The exam was otherwise without abnormality. No                            inflammatory changes noted. No polyps  Biopsies for histology were taken with a cold                            forceps from the right colon, left colon and                            transverse colon for evaluation of microscopic                            colitis. Complications:            No immediate complications. Estimated blood loss:                            Minimal. Estimated Blood Loss:     Estimated blood loss was minimal. Impression:               - The examined portion of the ileum was normal.                           - Internal hemorrhoids.                           - The examination was otherwise normal.                           - Biopsies were taken with a cold forceps from the                            right colon, left colon and transverse colon for                            evaluation of microscopic colitis. Recommendation:           - Patient has a contact number available for                             emergencies. The signs and symptoms of potential                            delayed complications were discussed with the                            patient. Return to normal activities tomorrow.                            Written discharge instructions were provided to the                            patient.                           - Resume previous diet.                           - Continue present medications.                           -  Await pathology results.                           - Repeat colonoscopy in 10 years for screening                            purposes Willaim Rayas. Claretta Kendra, MD 04/20/2019 12:22:14 PM This report has been signed electronically.

## 2019-04-20 NOTE — Progress Notes (Signed)
VS-DT Temp-LC  

## 2019-04-20 NOTE — Progress Notes (Signed)
Called to room to assist during endoscopic procedure.  Patient ID and intended procedure confirmed with present staff. Received instructions for my participation in the procedure from the performing physician.  

## 2019-04-20 NOTE — Patient Instructions (Signed)
YOU HAD AN ENDOSCOPIC PROCEDURE TODAY AT [facility]:   Refer to the procedure report that was given to you for any specific questions about what was found during the examination.  If the procedure report does not answer your questions, please call your gastroenterologist to clarify.  If you requested that your care partner not be given the details of your procedure findings, then the procedure report has been included in a sealed envelope for you to review at your convenience later.  YOU SHOULD EXPECT: Some feelings of bloating in the abdomen. Passage of more gas than usual.  Walking can help get rid of the air that was put into your GI tract during the procedure and reduce the bloating. If you had a lower endoscopy (such as a colonoscopy or flexible sigmoidoscopy) you may notice spotting of blood in your stool or on the toilet paper. If you underwent a bowel prep for your procedure, you may not have a normal bowel movement for a few days.  Please Note:  You might notice some irritation and congestion in your nose or some drainage.  This is from the oxygen used during your procedure.  There is no need for concern and it should clear up in a day or so.  SYMPTOMS TO REPORT IMMEDIATELY:   Following lower endoscopy (colonoscopy or flexible sigmoidoscopy):  Excessive amounts of blood in the stool  Significant tenderness or worsening of abdominal pains  Swelling of the abdomen that is new, acute  Fever of 100F or higher  For urgent or emergent issues, a gastroenterologist can be reached at any hour by calling [phone].  DIET:  We do recommend a small meal at first, but then you may proceed to your regular diet.  Drink plenty of fluids but you should avoid alcoholic beverages for 24 hours.  ACTIVITY:  You should plan to take it easy for the rest of today and you should NOT DRIVE or use heavy machinery until tomorrow (because of the sedation medicines used during the test).     FOLLOW UP: Our staff will call the number listed on your records 48-72 hours following your procedure to check on you and address any questions or concerns that you may have regarding the information given to you following your procedure. If we do not reach you, we will leave a message.  We will attempt to reach you two times.  During this call, we will ask if you have developed any symptoms of COVID 19. If you develop any symptoms (ie: fever, flu-like symptoms, shortness of breath, cough etc.) before then, please call [phone].  If you test positive for Covid 19 in the 2 weeks post procedure, please call and report this information to [place].    If any biopsies were taken you will be contacted by phone or by letter within the next 1-3 weeks.  Please call us at [phone] if you have not heard about the biopsies in 3 weeks.   SIGNATURES/CONFIDENTIALITY: You and/or your care partner have signed paperwork which will be entered into your electronic medical record.  These signatures attest to the fact that that the information above on your After Visit Summary has been reviewed and is understood.  Full responsibility of the confidentiality of this discharge information lies with you and/or your care-partner.  Await pathology  Please read over handout about hemorrhoids  Continue your normal medications  Next colonoscopy- 10 years

## 2019-04-20 NOTE — Progress Notes (Signed)
Report given to PACU, vss 

## 2019-04-22 ENCOUNTER — Telehealth: Payer: Self-pay

## 2019-04-22 NOTE — Telephone Encounter (Signed)
2nd follow up call made.  NALM 

## 2019-04-22 NOTE — Telephone Encounter (Signed)
1st follow up call made.  NALM 

## 2019-08-26 ENCOUNTER — Ambulatory Visit: Payer: 59 | Attending: Internal Medicine

## 2019-08-26 DIAGNOSIS — Z23 Encounter for immunization: Secondary | ICD-10-CM

## 2019-08-26 NOTE — Progress Notes (Signed)
   Covid-19 Vaccination Clinic  Name:  Kevin Lowe    MRN: [id number] DOB: February 03, 1971  08/26/2019  [name] was observed post Covid-19 immunization for 15 minutes without incident. He was provided with Vaccine Information Sheet and instruction to access the V-Safe system.   [name] was instructed to call 911 with any severe reactions post vaccine: Marland Kitchen Difficulty breathing  . Swelling of face and throat  . A fast heartbeat  . A bad rash all over body  . Dizziness and weakness   Immunizations Administered    Name Date Dose VIS Date Route   Pfizer COVID-19 Vaccine 08/26/2019  8:34 AM 0.3 mL 04/23/2019 Intramuscular   Manufacturer: [place]   Lot: [id number]   [place]: [id number]

## 2019-09-20 ENCOUNTER — Ambulatory Visit: Payer: 59 | Attending: Internal Medicine

## 2019-09-20 DIAGNOSIS — Z23 Encounter for immunization: Secondary | ICD-10-CM

## 2019-09-20 NOTE — Progress Notes (Signed)
   Covid-19 Vaccination Clinic  Name:  Kevin Lowe    MRN: [id number] DOB: 09/14/1970  09/20/2019  [name] was observed post Covid-19 immunization for 15 minutes without incident. He was provided with Vaccine Information Sheet and instruction to access the V-Safe system.   [name] was instructed to call 911 with any severe reactions post vaccine: Marland Kitchen Difficulty breathing  . Swelling of face and throat  . A fast heartbeat  . A bad rash all over body  . Dizziness and weakness   Immunizations Administered    Name Date Dose VIS Date Route   Pfizer COVID-19 Vaccine 09/20/2019  8:29 AM 0.3 mL 07/07/2018 Intramuscular   Manufacturer: [place]   Lot: [id number]   [place]: [id number]

## 2020-02-11 DIAGNOSIS — D235 Other benign neoplasm of skin of trunk: Secondary | ICD-10-CM

## 2020-02-11 HISTORY — DX: Other benign neoplasm of skin of trunk: D23.5

## 2020-02-24 ENCOUNTER — Encounter: Payer: 59 | Admitting: Family Medicine

## 2020-03-06 ENCOUNTER — Encounter: Payer: Self-pay | Admitting: Family Medicine

## 2022-01-16 ENCOUNTER — Encounter: Payer: Self-pay | Admitting: Internal Medicine

## 2022-02-19 ENCOUNTER — Encounter: Payer: Self-pay | Admitting: Family Medicine

## 2022-02-19 ENCOUNTER — Encounter: Payer: Self-pay | Admitting: Internal Medicine

## 2022-02-19 NOTE — Progress Notes (Unsigned)
No chief complaint on file.   Kevin Lowe is a 51 y.o. male who presents for a complete physical.   He has the following concerns:  He previously reported some chronic frequent urination, reports his prostate is enlarged. He saw urologist in the past (not following there).  He drinks a lot of water, 2 cups coffee/d.  Stream is weaker at night, up 2-3x/night. Stream is fine during the day.  He denies any significant changes.   H/o mild hyperlipidemia:  Cholesterol had improved with dietary changes years ago.  He continues to follow a lowfat, low cholesterol diet. He eats healthy, and exercises regularly. On the last check in 2020 he was eating 12-18 eggs/week, but had cut back significantly on number of egg yolks (though LDL didn't improve).  He also cut back on cheese.  Current diet  UPDATE  Lab Results  Component Value Date   CHOL 223 (H) 02/04/2019   HDL 54 02/04/2019   LDLCALC 153 (H) 02/04/2019   TRIG 90 02/04/2019   CHOLHDL 4.1 02/04/2019   He was noted to have elevated fasting glucose in 2017, sugar 108. Glucose and A1c were normal in 2018, glucose has been <100 since then (last check in 2020).   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  This hasn't changed at all, and realizes he doesn't eat as well when drinking. Had been counseled about risks of binge drinking and health effects at prior physicals.  ANY CHANGE?  Immunization History  Administered Date(s) Administered   Influenza Split 02/19/2012   Influenza Whole 01/16/2011   Influenza,inj,Quad PF,6+ Mos 01/22/2016, 01/23/2017, 01/29/2018, 02/04/2019   PFIZER(Purple Top)SARS-COV-2 Vaccination 08/26/2019, 09/20/2019   Tdap 01/25/2011   Last colonoscopy: 04/2019 Dr. Adela Lank.  Internal hemorrhoids, normal biopsies  Last PSA:   Lab Results  Component Value Date   PSA1 0.5 02/04/2019   PSA1 0.5 01/29/2018   PSA 0.5 01/23/2017   PSA 0.51 10/06/2013  (had one done in 2017 also,  at urologist) Dentist: twice yearly  Ophtho: 2 years ago (needs glasses). Exercise:  He is "active"--kayaks, walks the dog 3-4x/week high intensity interval training (cardio and weights)   PMH, PSH, SH and FH were reviewed/updated   ROS: The patient denies anorexia, fever, headaches, vision loss (needs reading glasses), decreased hearing, ear pain, hoarseness, chest pain, palpitations, dizziness, syncope, dyspnea on exertion, cough, swelling, nausea, vomiting, diarrhea, constipation, abdominal pain, melena, hematochezia, indigestion/heartburn, hematuria, incontinence, erectile dysfunction, nocturia, weakened urine stream, dysuria, genital lesions, numbness, tingling, weakness, tremor, suspicious skin lesions, depression, abnormal bleeding/bruising, or enlarged lymph nodes. No further back or neck pain. Some urinary frequency, chronic and unchanged. Up 2-3/night to void. Drinks a lot of water. Some weaker stream only at night. Sees dermatologist regularly. Cyst on back of head is unchanged, denies pain. Bowels normalized with use of probiotics (had frequent, looser stools in 2020, prior to colonoscopy). Sees dermatologist (Dr. Karlyn Agee)    PHYSICAL EXAM:  There were no vitals taken for this visit.  Wt Readings from Last 3 Encounters:  04/20/19 186 lb (84.4 kg)  03/11/19 186 lb (84.4 kg)  02/04/19 188 lb 6.4 oz (85.5 kg)    General Appearance:  Alert, cooperative, no distress, appears stated age   Head:  Normocephalic, without obvious abnormality, atraumatic. Head is shaven. There is a sebaceous cyst left posterior scalp, nontender  Eyes:  PERRL, conjunctiva/corneas clear, EOM's intact, fundi benign   Ears:  Normal TM's and external ear canals  Nose:  Normal, no drainage or sinus tenderness  Throat:  Normal mucosa, no lesions  Neck:  Supple, no lymphadenopathy; thyroid: no enlargement/ tenderness/nodules; no carotid bruit or JVD   Back:  Spine nontender, no curvature, ROM  normal, no CVA tenderness.   Lungs:  Clear to auscultation bilaterally without wheezes, rales or ronchi; respirations unlabored   Chest Wall:  No tenderness or deformity   Heart:  Regular rate and rhythm, S1 and S2 normal, no murmur, rub or gallop   Breast Exam:  No chest wall tenderness, masses or gynecomastia   Abdomen:  Soft, non-tender, nondistended, normoactive bowel sounds, no masses, no hepatosplenomegaly   Genitalia:  Normal external genitalia without lesions. Testicles descended bilaterally, no masses.  No inguinal hernias  Rectal:  Normal sphincter tone, no mass, heme negative stool.  Prostate is just mildly enlarged for age, smooth, no nodules or asymmetry  Extremities:  No clubbing, cyanosis or edema   Pulses:  2+ and symmetric all extremities   Skin:  Skin color, texture, turgor normal, no rashes or lesions   Lymph nodes:  Cervical, supraclavicular, and inguinal nodes normal   Neurologic:  CNII-XII intact, normal strength, sensation and gait; reflexes 2+ and symmetric throughout.                  Psych: Normal mood, affect, hygiene and grooming  ***update cyst on scalp (head)  ASSESSMENT/PLAN:  Flu, TdaP and COVID all due--I'd rec just giving 2, and return for 3rd (I'd return in 2 weeks for COVID, but could be Tetanus if he prefers to get COVID booster today).   Cbc, c-met, lipids, HIV, Hep C Doesn't need PSA (though had been getting)--rec age 76,   Recommended at least 30 minutes of aerobic activity at least 5 days/week, weight bearing exercise at least 2x/week; proper sunscreen use reviewed; healthy diet and alcohol recommendations (less than or equal to 2 drinks/day--needs to cut back on weekend alcohol consumption) reviewed; regular seatbelt use; changing batteries in smoke detectors. Immunization recommendations discussed--continue yearly flu shots, given today.  TdaP COVID booster Colonoscopy recommendations reviewed  Risks/recommendations for PSA screening reviewed  in detail.   F/u 1 year, sooner prn.

## 2022-02-19 NOTE — Patient Instructions (Incomplete)
  HEALTH MAINTENANCE RECOMMENDATIONS:  It is recommended that you get at least 30 minutes of aerobic exercise at least 5 days/week (for weight loss, you may need as much as 60-90 minutes). This can be any activity that gets your heart rate up. This can be divided in 10-15 minute intervals if needed, but try and build up your endurance at least once a week.  Weight bearing exercise is also recommended twice weekly.  Eat a healthy diet with lots of vegetables, fruits and fiber.  "Colorful" foods have a lot of vitamins (ie green vegetables, tomatoes, red peppers, etc).  Limit sweet tea, regular sodas and alcoholic beverages, all of which has a lot of calories and sugar.  Up to 2 alcoholic drinks daily may be beneficial for men (unless trying to lose weight, watch sugars).  Drink a lot of water.  Sunscreen of at least SPF 30 should be used on all sun-exposed parts of the skin when outside between the hours of 10 am and 4 pm (not just when at beach or pool, but even with exercise, golf, tennis, and yard work!)  Use a sunscreen that says "broad spectrum" so it covers both UVA and UVB rays, and make sure to reapply every 1-2 hours.  Remember to change the batteries in your smoke detectors when changing your clock times in the spring and fall.  Carbon monoxide detectors are recommended for your home.  Use your seat belt every time you are in a car, and please drive safely and not be distracted with cell phones and texting while driving.  Please try and cut back on the alcohol intake per day, as we discussed (3 at a time or less). Try and not out-do all of your good eating patterns from the week with poor snacking choices on the weekends.  Prior to your next physical, try and remember to check your insurance to ensure that the shingles vaccine (Shingrix) is covered. This is recommended starting at age 66.  You can feel free to try a lactose/dairy-free diet for 2 weeks. If this helps your stomach issues, then  avoid milk/dairy or use lactaid (tablets prior vs lactaid milk). I encourage you to use the probiotic more regularly.

## 2022-02-20 ENCOUNTER — Encounter: Payer: Self-pay | Admitting: Family Medicine

## 2022-02-20 ENCOUNTER — Ambulatory Visit (INDEPENDENT_AMBULATORY_CARE_PROVIDER_SITE_OTHER): Payer: BC Managed Care – PPO | Admitting: Family Medicine

## 2022-02-20 VITALS — BP 126/80 | HR 72 | Ht 71.0 in | Wt 183.6 lb

## 2022-02-20 DIAGNOSIS — E7800 Pure hypercholesterolemia, unspecified: Secondary | ICD-10-CM | POA: Diagnosis not present

## 2022-02-20 DIAGNOSIS — R220 Localized swelling, mass and lump, head: Secondary | ICD-10-CM

## 2022-02-20 DIAGNOSIS — Z0000 Encounter for general adult medical examination without abnormal findings: Secondary | ICD-10-CM | POA: Diagnosis not present

## 2022-02-20 DIAGNOSIS — Z23 Encounter for immunization: Secondary | ICD-10-CM | POA: Diagnosis not present

## 2022-02-20 DIAGNOSIS — Z114 Encounter for screening for human immunodeficiency virus [HIV]: Secondary | ICD-10-CM

## 2022-02-20 DIAGNOSIS — Z8042 Family history of malignant neoplasm of prostate: Secondary | ICD-10-CM | POA: Diagnosis not present

## 2022-02-20 DIAGNOSIS — Z1159 Encounter for screening for other viral diseases: Secondary | ICD-10-CM

## 2022-02-20 DIAGNOSIS — Z125 Encounter for screening for malignant neoplasm of prostate: Secondary | ICD-10-CM

## 2022-02-20 LAB — POCT URINALYSIS DIP (PROADVANTAGE DEVICE)
Bilirubin, UA: NEGATIVE
Blood, UA: NEGATIVE
Glucose, UA: NEGATIVE mg/dL
Ketones, POC UA: NEGATIVE mg/dL
Leukocytes, UA: NEGATIVE
Nitrite, UA: NEGATIVE
Protein Ur, POC: NEGATIVE mg/dL
Specific Gravity, Urine: 1.015
Urobilinogen, Ur: NEGATIVE
pH, UA: 7 (ref 5.0–8.0)

## 2022-02-21 LAB — CBC WITH DIFFERENTIAL/PLATELET
Basophils Absolute: 0 10*3/uL (ref 0.0–0.2)
Basos: 1 %
EOS (ABSOLUTE): 0.1 10*3/uL (ref 0.0–0.4)
Eos: 2 %
Hematocrit: 46.9 % (ref 37.5–51.0)
Hemoglobin: 16.3 g/dL (ref 13.0–17.7)
Immature Grans (Abs): 0 10*3/uL (ref 0.0–0.1)
Immature Granulocytes: 0 %
Lymphocytes Absolute: 1.5 10*3/uL (ref 0.7–3.1)
Lymphs: 39 %
MCH: 31.2 pg (ref 26.6–33.0)
MCHC: 34.8 g/dL (ref 31.5–35.7)
MCV: 90 fL (ref 79–97)
Monocytes Absolute: 0.4 10*3/uL (ref 0.1–0.9)
Monocytes: 10 %
Neutrophils Absolute: 1.8 10*3/uL (ref 1.4–7.0)
Neutrophils: 48 %
Platelets: 233 10*3/uL (ref 150–450)
RBC: 5.22 x10E6/uL (ref 4.14–5.80)
RDW: 12.2 % (ref 11.6–15.4)
WBC: 3.7 10*3/uL (ref 3.4–10.8)

## 2022-02-21 LAB — COMPREHENSIVE METABOLIC PANEL
ALT: 28 IU/L (ref 0–44)
AST: 19 IU/L (ref 0–40)
Albumin/Globulin Ratio: 2.3 — ABNORMAL HIGH (ref 1.2–2.2)
Albumin: 5.2 g/dL — ABNORMAL HIGH (ref 4.1–5.1)
Alkaline Phosphatase: 58 IU/L (ref 44–121)
BUN/Creatinine Ratio: 13 (ref 9–20)
BUN: 13 mg/dL (ref 6–24)
Bilirubin Total: 0.7 mg/dL (ref 0.0–1.2)
CO2: 24 mmol/L (ref 20–29)
Calcium: 9.6 mg/dL (ref 8.7–10.2)
Chloride: 101 mmol/L (ref 96–106)
Creatinine, Ser: 0.99 mg/dL (ref 0.76–1.27)
Globulin, Total: 2.3 g/dL (ref 1.5–4.5)
Glucose: 96 mg/dL (ref 70–99)
Potassium: 4.6 mmol/L (ref 3.5–5.2)
Sodium: 141 mmol/L (ref 134–144)
Total Protein: 7.5 g/dL (ref 6.0–8.5)
eGFR: 93 mL/min/{1.73_m2} (ref >59)

## 2022-02-21 LAB — LIPID PANEL
Chol/HDL Ratio: 3.8 ratio (ref 0.0–5.0)
Cholesterol, Total: 211 mg/dL — ABNORMAL HIGH (ref 100–199)
HDL: 56 mg/dL (ref >39)
LDL Chol Calc (NIH): 135 mg/dL — ABNORMAL HIGH (ref 0–99)
Triglycerides: 110 mg/dL (ref 0–149)
VLDL Cholesterol Cal: 20 mg/dL (ref 5–40)

## 2022-02-21 LAB — HEPATITIS C ANTIBODY: Hep C Virus Ab: NONREACTIVE

## 2022-02-21 LAB — HIV ANTIBODY (ROUTINE TESTING W REFLEX): HIV Screen 4th Generation wRfx: NONREACTIVE

## 2022-02-21 LAB — PSA: Prostate Specific Ag, Serum: 0.5 ng/mL (ref 0.0–4.0)

## 2022-03-22 ENCOUNTER — Other Ambulatory Visit: Payer: Self-pay | Admitting: Surgery

## 2022-04-11 ENCOUNTER — Telehealth: Payer: Self-pay | Admitting: Family Medicine

## 2022-04-11 NOTE — Telephone Encounter (Signed)
Pt has made appt for Monday afternoon, he can't come in any earlier.  He thinks he is having internal hemorrhoids and is miserable.   He states his butt hole shuts up and can hardly get any poop out.  Is there anything you can tell him to do before his appt.  He states his butt is on fire after he poops and he has to cancel his work day.  He states you can leave message on voice mail.

## 2022-04-11 NOTE — Telephone Encounter (Signed)
If he suspects internal hemorrhoids, he can try over-the-counter Anusol HC suppositories (these are available over-the-counter; there is a prescription strength, but needs to be evaluated prior to using that). It is important that he drinks plenty of water and follows a high fiber diet.  I'll send this via MyChart message, but if he hasn't read it, please call pt and advise.

## 2022-04-12 ENCOUNTER — Other Ambulatory Visit: Payer: Self-pay | Admitting: Surgery

## 2022-04-12 HISTORY — PX: LIPOMA EXCISION: SHX5283

## 2022-04-12 NOTE — Telephone Encounter (Signed)
Called and left detailed message on pt's phone about what to use OTC until his appt

## 2022-04-15 ENCOUNTER — Encounter: Payer: Self-pay | Admitting: Family Medicine

## 2022-04-15 ENCOUNTER — Ambulatory Visit (INDEPENDENT_AMBULATORY_CARE_PROVIDER_SITE_OTHER): Payer: BC Managed Care – PPO | Admitting: Family Medicine

## 2022-04-15 VITALS — BP 120/80 | HR 64 | Ht 71.0 in | Wt 184.4 lb

## 2022-04-15 DIAGNOSIS — K602 Anal fissure, unspecified: Secondary | ICD-10-CM | POA: Diagnosis not present

## 2022-04-15 DIAGNOSIS — K648 Other hemorrhoids: Secondary | ICD-10-CM

## 2022-04-15 DIAGNOSIS — K625 Hemorrhage of anus and rectum: Secondary | ICD-10-CM

## 2022-04-15 MED ORDER — HYDROCORTISONE ACETATE 25 MG RE SUPP: 25.0000 mg | Freq: Two times a day (BID) | RECTAL | 0 refills | Status: AC

## 2022-04-15 NOTE — Progress Notes (Signed)
Chief Complaint  Patient presents with   Hemorrhoids    For the past 8 days he has been having some blood in bowel movements, pain with bowl movements. Feels like a baked potato"with spikes in it." Said he has internal hemorrhoids.    He presents with complaint of rectal pain and rectal bleeding.  Admits that his diet hasn't been good--had fried [place], beers, tacos later that week Stomach was upset, having frequent bowel movements, not much coming out, painful to pass the stool. He noted bright red blood on the toilet paper, just a little on the stool. He was taking pepto bismol, didn't help.  He realized it might be a hemorrhoid. Recalls having internal hemorrhoids noted on his prior colonoscopy. He never got the OTC Anusol HC that I suggested. Cut back on the bad foods, stuck to crackers, ate "sick food" and felt much better.  He had surgery Friday (lipoma removal from scalp, Dr. Ninfa Linden) He was eating lighter, felt better by Saturday. He had beer and pizza Saturday, because he was feeling much better. Last night had lasagna. 5 am today--he woke up and had frequent bowel movements. Had the frequency, not the pain that he had last week. Less blood was noted. Feels fine now.  Drinks a lot of water. Admits to drinking less on the days he isn't eating as well and having beer.  He eats a very healthy diet most of the time. Discussed in detail at his physical--unhealthy food when out drinking beer with friends, not often, but this is what upsets his stomach.  Chart reviewed-- 04/2019 internal hemorrhoids were noted on colonoscopy. No prior issues with hemorrhoids or rectal bleeding. Has had EXTERNAL hemorrhoids in the past (per pt).  PMH, PSH, SH reviewed and updated  Outpatient Encounter Medications as of 04/15/2022  Medication Sig Note   hydrocortisone (ANUSOL-HC) 25 MG suppository Place 1 suppository (25 mg total) rectally 2 (two) times daily.    fish oil-omega-3 fatty acids 1000  MG capsule Take 2 g by mouth daily.   (Patient not taking: Reported on 04/15/2022) 04/15/2022: Has not taken in the last 24hrs   Multiple Vitamins-Minerals (MULTIVITAMIN WITH MINERALS) tablet Take 1 tablet by mouth daily.   (Patient not taking: Reported on 04/15/2022) 04/15/2022: Has not taken in the last 24hrs   Probiotic Product (PROBIOTIC DAILY PO) Take 1 capsule by mouth daily. (Patient not taking: Reported on 04/15/2022) 04/15/2022: Has not taken in the last 24hrs    SPIRULINA PO Take 1 tablet by mouth daily. (Patient not taking: Reported on 04/15/2022) 04/15/2022: Has not taken in the last 24hrs    traMADol (ULTRAM) 50 MG tablet Take by mouth. (Patient not taking: Reported on 04/15/2022) 04/15/2022: Has not taken in the last 24hrs    Turmeric, Curcuma Longa, (CURCUMIN) POWD 1 capsule by Does not apply route daily. (Patient not taking: Reported on 04/15/2022) 04/15/2022: Has not taken in the last 24hrs    No facility-administered encounter medications on file as of 04/15/2022.   No Known Allergies  ROS: no fever, chills, URI symptoms. Slight headache today, recent scalp surgery. No n/v/d.  Some constipation and painful bowel movement, frequency and BRB per HPI. Denies abdominal pain. No urinary complaints. Moods are good   PHYSICAL EXAM:  BP 120/80   Pulse 64   Ht '5\' 11"'$  (1.803 m)   Wt 184 lb 6.4 oz (83.6 kg)   BMI 25.72 kg/m   Pleasant, well-appearing male in no distress, in good spirits HEENT: conjunctiva  and sclera are clear, EOMI. Healing incision L posterior scalp Neck: no lymphadenopathy or mass Heart: regular rate and rhythm Lungs: clear bilaterally Abdomen: soft, nontender, no organomegaly or mass Rectal: 2 small tags externally, non-inflamed. There is an area posteriorly that is red, and tender--appears to have been recent fissure, no current bleeding. Unable to fully insert anoscope. Was able to insert it far enough to see that there are multiple inflamed internal hemorrhoids,  no bleeding. Psych: normal mood, affect, hygiene and grooming Neuro: alert and oriented, cranial nerves grossly intact, normal gait.   ASSESSMENT/PLAN:  Internal hemorrhoids - Anusol HC. Discussed high fiber diet, adequate hydration. If persistent or frequent flares, can refer for banding - Plan: hydrocortisone (ANUSOL-HC) 25 MG suppository  Anal fissure - discussed; encouraged high fiber diet, adequate hydration, topical meds.  Consider stool softeners to soften stool.  Rectal bleeding - Plan: PR DIAGNOSTIC ANOSCOPY   Reviewed high fiber diet, healthy eating plan, in detail. He is good most of the time, but diet is bad when hanging out with friends--beer and poor food choices (fried), whereas the rest of the time he eats very healthy.

## 2023-03-17 ENCOUNTER — Encounter: Payer: BC Managed Care – PPO | Admitting: Family Medicine

## 2023-04-27 NOTE — Progress Notes (Unsigned)
No chief complaint on file.      PMH, PSH, SH reviewed   ROS:    PHYSICAL EXAM:  There were no vitals taken for this visit.      ASSESSMENT/PLAN:

## 2023-04-28 ENCOUNTER — Ambulatory Visit: Payer: BC Managed Care – PPO | Admitting: Family Medicine

## 2023-05-14 NOTE — Progress Notes (Signed)
 Chief Complaint  Patient presents with   Annual Exam    Fasting (had labs drawn this am) annual exam. Had flu shot, declined covid booster. Will schedule NV for shingrix. Having GI issues. Had 6 bowel movements today alone. Gradually over the last 6 months but has worsened in the last 30 days. Dull cramping that is worse when he drinks alcohol. And has frequent bathroom visits.     Kevin Lowe is a 53 y.o. male who presents for a complete physical.    His chief concern is GI symptoms. He has dull cramps--wakes him up in the morning after drinking alcohol, with cramps and needing to go to the bathroom. Sometimes is constipated, sometimes he will pass 4 BM's (normal, not loose, no blood).  Last alcohol was NYE (2 nights ago)--had 3 BM's yesterday, 6 BM's today.  Only has had a salad today. Stools aren't loose.  Previous routine would be just 1 BM daily after coffee.  Gradually getting worse, more in the last 6 months. No changes to diet when drinking (we had previously discussed unhealthy eating patterns while drinking, states he improved this).  Has flax and chia seeds in his smoothies every night (for a couple of years), along with whey protein, milk, fruit.  2 days ago he added in raw spinach to his smoothies (and continues to eat spinach salad daily). Denies any discomfort, described as kind of like overeating, or when hungry.  He has history of periodic stomach issues--fecal urgency, slight discomfort, and when in restaurants (needs to go to the bathroom before leaving the restaurant). This continues.  He previously had GI issues, saw GI and had normal colonoscopy, including biopsies. His symptoms had previously resolved with probiotic use. Doesn't continue to use it regularly due to the cost, just prn. Today he states that he doesn't recall much benefit from the probiotic, given the price, so stopped taking them.  Internal hemorrhoids--had visit 04/2022 with flare, and was  prescribed Anusol  HC suppositories. Had a flare last week (after having some beer--had some constipation, small, hard stools). He didn't need to use any Anusol  HC, still has some. Was effective when used last year.    H/o mild hyperlipidemia:  Cholesterol has been up and down, related to his diet. In 2020, when LDL 153, he had been eating eating a lot of eggs. Lipids last year were improved--he had reported eating: 1-2 egg yolks/week, has 10-12 egg whites. Some bleu cheese on a salad, 2% milk in coffee.  No creamy dressings, soups, sauces.  Infrequent red meat (once every 10 days). Rare fried foods (once a month).  No mayo, no butter, just olive oil and avocado oil.  Frozen pizza 1-2x/month. Denies changes in diet this year--if anything is better in that he is not snacking on bad things while drinking as often. Due for recheck of lipids.  Component Ref Range & Units (hover) 1 yr ago 4 yr ago 5 yr ago 6 yr ago 7 yr ago 9 yr ago 11 yr ago  Cholesterol, Total 211 High  223 High  217 High       Triglycerides 110 90 70 78 R 141 R 115 R 110 R  HDL 56 54 56 52 R 48 R 50 54  VLDL Cholesterol Cal 20 16 14       LDL Chol Calc (NIH) 135 High  153 High        Chol/HDL Ratio 3.8 4.1 CM 3.9 CM 3.7 R 3.6 R 4.1 R 3.8  R    He was noted to have elevated fasting glucose in 2017, sugar 108. Glucose and A1c were normal in 2018, glucose has been <100 since then.   He reported excessive alcohol use on weekends (5-6/day on the weekends).  It was previously noted that he doesn't eat as well when drinking--chips, subs. Had been counseled about risks of binge drinking and health effects at prior physicals. He reports cutting back, keeping it to 4-5 drinks, just 2x/week. He is eating cleaner, not as bad when drinking as in the past. He has gained a lot of weight in the last year--thinks it could be muscle.  Has been doing a lot of weight lifting.  Denies changes to his pants fitting, but admits it could be a combo  (gain of fat and muscle).   Immunization History  Administered Date(s) Administered   Influenza Split 02/19/2012   Influenza Whole 01/16/2011   Influenza,inj,Quad PF,6+ Mos 01/22/2016, 01/23/2017, 01/29/2018, 02/04/2019, 02/20/2022   Influenza-Unspecified 05/13/2023   PFIZER(Purple Top)SARS-COV-2 Vaccination 08/26/2019, 09/20/2019   Tdap 01/25/2011, 02/20/2022   Last colonoscopy: 04/2019 Dr. Leigh.  Internal hemorrhoids, normal biopsies  Last PSA:   Lab Results  Component Value Date   PSA1 0.5 02/20/2022   PSA1 0.5 02/04/2019   PSA1 0.5 01/29/2018   PSA 0.5 01/23/2017   PSA 0.51 10/06/2013  (had one done in 2017 also, at urologist) Dentist: twice yearly  Ophtho 4-5 years ago, uses readers. Exercise: Goes to the gym 4-5x/week--lifts weights, walks on treadmill for 60 mins. HIIT workouts less often, just once a month.   PMH, PSH, SH and FH were reviewed/updated  Outpatient Encounter Medications as of 05/15/2023  Medication Sig Note   fish oil-omega-3 fatty acids 1000 MG capsule Take 2 g by mouth daily. 04/15/2022: Has not taken in the last 24hrs   Multiple Vitamins-Minerals (MULTIVITAMIN WITH MINERALS) tablet Take 1 tablet by mouth daily.    SPIRULINA PO Take 1 tablet by mouth daily.    Turmeric, Curcuma Longa, (CURCUMIN) POWD 1 capsule by Does not apply route daily. 05/15/2023: Uses spice in foods   [DISCONTINUED] Probiotic Product (PROBIOTIC DAILY PO) Take 1 capsule by mouth daily. 04/15/2022: Has not taken in the last 24hrs    hydrocortisone  (ANUSOL -HC) 25 MG suppository Place 1 suppository (25 mg total) rectally 2 (two) times daily. (Patient not taking: Reported on 05/15/2023) 05/15/2023: As needed   No facility-administered encounter medications on file as of 05/15/2023.   No Known Allergies  ROS: The patient denies anorexia, fever, headaches, vision loss (now uses reading glasses), decreased hearing, ear pain, hoarseness, chest pain, palpitations, dizziness, syncope, dyspnea  on exertion, cough, swelling, nausea, vomiting, diarrhea, constipation, melena, hematochezia, indigestion/heartburn, hematuria, incontinence, erectile dysfunction, nocturia, weakened urine stream, dysuria, genital lesions, numbness, tingling, weakness, tremor, suspicious skin lesions, depression, abnormal bleeding/bruising, or enlarged lymph nodes. No further back or neck pain. Some urinary frequency, chronic and unchanged. Up 2-3/night to void. Drinks a lot of water.  Frequent BM's and urgency after eating, per HPI Sees dermatologist (Dr. Rolan Molt) +weight gain   PHYSICAL EXAM:  BP 130/80   Pulse 68   Ht 5' 11 (1.803 m)   Wt 197 lb (89.4 kg)   BMI 27.48 kg/m   Wt Readings from Last 3 Encounters:  05/15/23 197 lb (89.4 kg)  04/15/22 184 lb 6.4 oz (83.6 kg)  02/20/22 183 lb 9.6 oz (83.3 kg)    General Appearance:  Alert, cooperative, no distress, appears stated age   Head:  Normocephalic, without obvious abnormality, atraumatic. Head is shaven. Mildly hypertrophic scar at posterior scalp  Eyes:  PERRL, conjunctiva/corneas clear, EOM's intact, fundi benign   Ears:  Normal TM and external ear canal on the right, the left was obscured by cerumen  Nose:  Normal, no drainage or sinus tenderness  Throat:  Normal mucosa, no lesions  Neck:  Supple, no lymphadenopathy; thyroid: no enlargement/ tenderness/nodules; no carotid bruit or JVD   Back:  Spine nontender, no curvature, ROM normal, no CVA tenderness.   Lungs:  Clear to auscultation bilaterally without wheezes, rales or ronchi; respirations unlabored   Chest Wall:  No tenderness or deformity   Heart:  Regular rate and rhythm, S1 and S2 normal, no murmur, rub or gallop   Breast Exam:  No chest wall tenderness, masses or gynecomastia   Abdomen:  Soft, non-tender, nondistended, normoactive bowel sounds, no masses, no hepatosplenomegaly   Genitalia:  Normal external genitalia without lesions. Testicles descended bilaterally, no masses.   No inguinal hernias  Rectal:  Normal sphincter tone, no mass, heme negative stool.  Prostate is just mildly enlarged for age, smooth, no nodules or asymmetry  Extremities:  No clubbing, cyanosis or edema   Pulses:  2+ and symmetric all extremities   Skin:  Skin color, texture, turgor normal, no rashes or lesions   Lymph nodes:  Cervical, supraclavicular, and inguinal nodes normal   Neurologic:  CNII-XII intact, normal strength, sensation and gait; reflexes 2+ and symmetric throughout.                  Psych: Normal mood, affect, hygiene and grooming    ASSESSMENT/PLAN:   Annual physical exam - Plan: PSA, Lipid panel, CMP14+EGFR, CBC with Differential/Platelet  Bowel habit changes - previously had normal colonoscopy/biopsies. To keep symptom/food diary and trial of probiotic. Many sx related to ETOH--encouraged dry January as trial  Pure hypercholesterolemia - reviewed lowfat, low cholesterol diet, due for recheck today. Discussed calcium score, and is interested - Plan: Lipid panel, CMP14+EGFR, CT CARDIAC SCORING (SELF PAY ONLY)  Internal hemorrhoids - Plan: CBC with Differential/Platelet  Weight gain - counseled re: healthy diet and exercise recs in detail. Encouraged to cut back on alcohol further  Family history of early CAD - pt asymptomatic, but +FH and hyperlipidemia. Discussed CT coronary scan, pt interested in pursuing - Plan: CT CARDIAC SCORING (SELF PAY ONLY)  Screening for prostate cancer - Plan: PSA   TSH was added to labs drawn this morning (due to weight gain, some constipation/bowel changes). Suspect it will be normal.  Recommended at least 30 minutes of aerobic activity at least 5 days/week, weight bearing exercise at least 2x/week; proper sunscreen use reviewed; healthy diet and alcohol recommendations (less than or equal to 2 drinks/day--needs to cut back on weekend alcohol consumption) reviewed; regular seatbelt use; changing batteries in smoke detectors.  Immunization recommendations discussed--continue yearly flu shots. Declined COVID booster. Discussed shingrix (risks/SE/benefit), and plans to return for NV. Colonoscopy recommendations reviewed, UTD. Discussed concerning symptoms that would be an indication to repeat (or at least f/u with GI)--no red flag symptoms currently. Risks/recommendations for PSA screening discussed.   F/u 1 year, sooner prn.   I recommend keeping a symptom diary--food log and notes about your bowel movements. This way you can determine triggers for your symptoms (it may not be the alcohol, but things you might be eating). I recommend doing a probiotic for 4 weeks to see if this can reset your gut flora. Continue a  high fiber diet.  Consider making your hour of cardio at the gym more of a HIIT workout (use the bike if it bothers you to run), vs try and get the HIIT workouts more often (this is something you used to do regularly).  Consider dry January--if you feel better, consider staying at a much lower intake of alcohol.  Consider another routine eye exam.

## 2023-05-14 NOTE — Patient Instructions (Addendum)
  HEALTH MAINTENANCE RECOMMENDATIONS:  It is recommended that you get at least 30 minutes of aerobic exercise at least 5 days/week (for weight loss, you may need as much as 60-90 minutes). This can be any activity that gets your heart rate up. This can be divided in 10-15 minute intervals if needed, but try and build up your endurance at least once a week.  Weight bearing exercise is also recommended twice weekly.  Eat a healthy diet with lots of vegetables, fruits and fiber.  Colorful foods have a lot of vitamins (ie green vegetables, tomatoes, red peppers, etc).  Limit sweet tea, regular sodas and alcoholic beverages, all of which has a lot of calories and sugar.  Up to 2 alcoholic drinks daily may be beneficial for men (unless trying to lose weight, watch sugars).  Drink a lot of water.  Sunscreen of at least SPF 30 should be used on all sun-exposed parts of the skin when outside between the hours of 10 am and 4 pm (not just when at beach or pool, but even with exercise, golf, tennis, and yard work!)  Use a sunscreen that says broad spectrum so it covers both UVA and UVB rays, and make sure to reapply every 1-2 hours.  Remember to change the batteries in your smoke detectors when changing your clock times in the spring and fall.  Carbon monoxide detectors are recommended for your home.  Use your seat belt every time you are in a car, and please drive safely and not be distracted with cell phones and texting while driving.  I recommend getting the new shingles vaccine (Shingrix).  This is a series of 2 injections, spaced 2 months apart.  It doesn't have to be exactly 2 months apart (but can't be sooner), if that isn't feasible for your schedule, but try and get them close to 2 months (and definitely within 6 months of each other, or else the efficacy of the vaccine drops off). This should be separated from other vaccines by at least 2 weeks.   I recommend keeping a symptom diary--food log and  notes about your bowel movements. This way you can determine triggers for your symptoms (it may not be the alcohol, but things you might be eating; the recent addition of spinach to your smoothies may contribute). I recommend doing a probiotic for 4 weeks to see if this can reset your gut flora (Align or others). Continue a high fiber diet.  Consider making your hour of cardio at the gym more of a HIIT workout (use the bike if it bothers you to run), vs try and get the HIIT workouts more often (this is something you used to do regularly).  Measure your true waist circumference at home (smallest portion, usually across your belly button).  This is the best way to gauge if you're gaining in your abdomen, vs gaining muscles).  Consider dry January--if you feel better, consider staying at a much lower intake of alcohol.  Consider another routine eye exam.

## 2023-05-15 ENCOUNTER — Ambulatory Visit (INDEPENDENT_AMBULATORY_CARE_PROVIDER_SITE_OTHER): Payer: BC Managed Care – PPO | Admitting: Family Medicine

## 2023-05-15 ENCOUNTER — Encounter: Payer: Self-pay | Admitting: Family Medicine

## 2023-05-15 VITALS — BP 130/80 | HR 68 | Ht 71.0 in | Wt 197.0 lb

## 2023-05-15 DIAGNOSIS — Z125 Encounter for screening for malignant neoplasm of prostate: Secondary | ICD-10-CM

## 2023-05-15 DIAGNOSIS — E7800 Pure hypercholesterolemia, unspecified: Secondary | ICD-10-CM | POA: Diagnosis not present

## 2023-05-15 DIAGNOSIS — Z23 Encounter for immunization: Secondary | ICD-10-CM

## 2023-05-15 DIAGNOSIS — R635 Abnormal weight gain: Secondary | ICD-10-CM | POA: Diagnosis not present

## 2023-05-15 DIAGNOSIS — Z0000 Encounter for general adult medical examination without abnormal findings: Secondary | ICD-10-CM | POA: Diagnosis not present

## 2023-05-15 DIAGNOSIS — K648 Other hemorrhoids: Secondary | ICD-10-CM | POA: Diagnosis not present

## 2023-05-15 DIAGNOSIS — R194 Change in bowel habit: Secondary | ICD-10-CM

## 2023-05-15 DIAGNOSIS — Z8249 Family history of ischemic heart disease and other diseases of the circulatory system: Secondary | ICD-10-CM

## 2023-05-16 ENCOUNTER — Encounter: Payer: Self-pay | Admitting: Family Medicine

## 2023-05-16 LAB — CMP14+EGFR
ALT: 34 [IU]/L (ref 0–44)
AST: 19 [IU]/L (ref 0–40)
Albumin: 4.6 g/dL (ref 4.1–5.1)
Alkaline Phosphatase: 72 [IU]/L (ref 44–121)
BUN/Creatinine Ratio: 20 (ref 9–20)
BUN: 20 mg/dL (ref 6–24)
Bilirubin Total: 0.3 mg/dL (ref 0.0–1.2)
CO2: 25 mmol/L (ref 20–29)
Calcium: 9.4 mg/dL (ref 8.7–10.2)
Chloride: 102 mmol/L (ref 96–106)
Creatinine, Ser: 0.99 mg/dL (ref 0.76–1.27)
Globulin, Total: 2.2 g/dL (ref 1.5–4.5)
Glucose: 108 mg/dL — ABNORMAL HIGH (ref 70–99)
Potassium: 5.1 mmol/L (ref 3.5–5.2)
Sodium: 141 mmol/L (ref 134–144)
Total Protein: 6.8 g/dL (ref 6.0–8.5)
eGFR: 93 mL/min/{1.73_m2} (ref >59)

## 2023-05-16 LAB — CBC WITH DIFFERENTIAL/PLATELET
Basophils Absolute: 0 10*3/uL (ref 0.0–0.2)
Basos: 0 %
EOS (ABSOLUTE): 0.2 10*3/uL (ref 0.0–0.4)
Eos: 4 %
Hematocrit: 45.7 % (ref 37.5–51.0)
Hemoglobin: 15.3 g/dL (ref 13.0–17.7)
Immature Grans (Abs): 0 10*3/uL (ref 0.0–0.1)
Immature Granulocytes: 0 %
Lymphocytes Absolute: 1.4 10*3/uL (ref 0.7–3.1)
Lymphs: 23 %
MCH: 30.6 pg (ref 26.6–33.0)
MCHC: 33.5 g/dL (ref 31.5–35.7)
MCV: 91 fL (ref 79–97)
Monocytes Absolute: 0.6 10*3/uL (ref 0.1–0.9)
Monocytes: 11 %
Neutrophils Absolute: 3.8 10*3/uL (ref 1.4–7.0)
Neutrophils: 62 %
Platelets: 233 10*3/uL (ref 150–450)
RBC: 5 x10E6/uL (ref 4.14–5.80)
RDW: 12.2 % (ref 11.6–15.4)
WBC: 6.1 10*3/uL (ref 3.4–10.8)

## 2023-05-16 LAB — LIPID PANEL
Chol/HDL Ratio: 4.5 {ratio} (ref 0.0–5.0)
Cholesterol, Total: 189 mg/dL (ref 100–199)
HDL: 42 mg/dL (ref >39)
LDL Chol Calc (NIH): 123 mg/dL — ABNORMAL HIGH (ref 0–99)
Triglycerides: 131 mg/dL (ref 0–149)
VLDL Cholesterol Cal: 24 mg/dL (ref 5–40)

## 2023-05-16 LAB — PSA: Prostate Specific Ag, Serum: 0.5 ng/mL (ref 0.0–4.0)

## 2023-05-17 LAB — TSH: TSH: 1.47 u[IU]/mL (ref 0.450–4.500)

## 2023-05-17 LAB — SPECIMEN STATUS REPORT

## 2023-05-28 ENCOUNTER — Ambulatory Visit (HOSPITAL_COMMUNITY)
Admission: RE | Admit: 2023-05-28 | Discharge: 2023-05-28 | Disposition: A | Discharge status: Home / Self Care | Payer: Self-pay | Source: Ambulatory Visit | Attending: Family Medicine | Admitting: Family Medicine

## 2023-05-28 DIAGNOSIS — Z8249 Family history of ischemic heart disease and other diseases of the circulatory system: Secondary | ICD-10-CM | POA: Insufficient documentation

## 2023-05-28 DIAGNOSIS — E7800 Pure hypercholesterolemia, unspecified: Secondary | ICD-10-CM | POA: Insufficient documentation

## 2023-05-29 ENCOUNTER — Encounter: Payer: Self-pay | Admitting: Family Medicine

## 2023-08-15 HISTORY — PX: BASAL CELL CARCINOMA EXCISION: SHX1214

## 2023-08-19 ENCOUNTER — Encounter: Payer: Self-pay | Admitting: *Deleted

## 2023-09-10 ENCOUNTER — Ambulatory Visit: Payer: Self-pay | Admitting: Family Medicine

## 2023-09-15 NOTE — Progress Notes (Unsigned)
   Joanna Muck, PhD, LAT, ATC acting as a scribe for Garlan Juniper, MD.  Kevin Lowe is a 53 y.o. male who presents to [facility] at [facility] today for neck and L arm pain x 1 month. Hx of neck issues 10 years ago. Pt locates pain to L side of his neck, into L trapz, and posterior L shoulder and upper arm. N/t fingers 1-3.  Radiates: yes UE Numbness/tingling: yes UE Weakness: no Aggravates: nothing in particular Treatments tried: PT @ [facility], stretches,   Pertinent review of systems: No fevers or chills  Relevant historical information: Neck pain.  History of lumbar radiculopathy requiring surgery.   Exam:  BP 138/88   Pulse 69   Ht 5\' 11"  (1.803 m)   Wt 189 lb (85.7 kg)   SpO2 98%   BMI 26.36 kg/m  General: Well Developed, well nourished, and in no acute distress.   MSK: C-spine: Normal appearing Nontender to palpation. Intact strength. Reflexes are intact. Positive Spurling's test.   Lab and Radiology Results  X-ray images cervical spine obtained today personally and independently interpreted. Fractures.  No severe degenerative changes. Await formal radiology review    Assessment and Plan: 53 y.o. male with neck pain and cervical radiculopathy primarily involving the left arm at C7 dermatomal pattern.  Fortunately no weakness at this time.  He has had some improvement with physical therapy.  Plan to finish out PT.  I have prednisone  and gabapentin to use if his symptoms worsen again.  If not improving after 6 weeks of PT he will let me know and I can proceed to an MRI cervical spine.   PDMP not reviewed this encounter. Orders Placed This Encounter  Procedures   DG Cervical Spine 2 or 3 views    Standing Status:   Future    Number of Occurrences:   1    Expiration Date:   09/15/2024    Reason for Exam (SYMPTOM  OR DIAGNOSIS REQUIRED):   neck pain    Preferred imaging location?:   [facility]   Meds ordered this encounter   Medications   predniSONE  (DELTASONE ) 50 MG tablet    Sig: Take 1 pill daily for 5 days    Dispense:  5 tablet    Refill:  0   gabapentin (NEURONTIN) 300 MG capsule    Sig: Take 1 capsule (300 mg total) by mouth 3 (three) times daily as needed.    Dispense:  90 capsule    Refill:  2     Discussed warning signs or symptoms. Please see discharge instructions. Patient expresses understanding.   The above documentation has been reviewed and is accurate and complete Garlan Juniper, M.D.

## 2023-09-16 ENCOUNTER — Ambulatory Visit (INDEPENDENT_AMBULATORY_CARE_PROVIDER_SITE_OTHER): Payer: Self-pay | Admitting: Family Medicine

## 2023-09-16 ENCOUNTER — Ambulatory Visit (INDEPENDENT_AMBULATORY_CARE_PROVIDER_SITE_OTHER)

## 2023-09-16 VITALS — BP 138/88 | HR 69 | Ht 71.0 in | Wt 189.0 lb

## 2023-09-16 DIAGNOSIS — M5412 Radiculopathy, cervical region: Secondary | ICD-10-CM | POA: Insufficient documentation

## 2023-09-16 DIAGNOSIS — M542 Cervicalgia: Secondary | ICD-10-CM

## 2023-09-16 MED ORDER — GABAPENTIN 300 MG PO CAPS: 300.0000 mg | ORAL_CAPSULE | Freq: Three times a day (TID) | ORAL | 2 refills | Status: DC | PRN

## 2023-09-16 MED ORDER — PREDNISONE 50 MG PO TABS: ORAL_TABLET | ORAL | 0 refills | Status: DC

## 2023-09-16 NOTE — Patient Instructions (Addendum)
 Thank you for coming in today.   Please get an Xray today before you leave   I've sent a prescription for Gabapentin & Prednisone  to your pharmacy.   Let me know how it goes

## 2023-09-18 NOTE — Progress Notes (Signed)
Cervical spine x-ray shows a little bit of arthritis.

## 2024-01-13 NOTE — Progress Notes (Unsigned)
 No chief complaint on file.   GI issues were last discussed at his physical in 05/2023: His chief concern is GI symptoms. He has dull cramps--wakes him up in the morning after drinking alcohol, with cramps and needing to go to the bathroom. Sometimes is constipated, sometimes he will pass 4 BM's (normal, not loose, no blood).   Last alcohol was NYE (2 nights ago)--had 3 BM's yesterday, 6 BM's today.  Only has had a salad today. Stools aren't loose.   Previous routine would be just 1 BM daily after coffee.  Gradually getting worse, more in the last 6 months. No changes to diet when drinking (we had previously discussed unhealthy eating patterns while drinking, states he improved this).   Has flax and chia seeds in his smoothies every night (for a couple of years), along with whey protein, milk, fruit.  2 days ago he added in raw spinach to his smoothies (and continues to eat spinach salad daily). Denies any discomfort, described as kind of like overeating, or when hungry.   He has history of periodic stomach issues--fecal urgency, slight discomfort, and when in restaurants (needs to go to the bathroom before leaving the restaurant). This continues.  He previously had GI issues, saw GI and had normal colonoscopy, including biopsies. His symptoms had previously resolved with probiotic use. Doesn't continue to use it regularly due to the cost, just prn. Today he states that he doesn't recall much benefit from the probiotic, given the price, so stopped taking them.   Bowel habit changes - previously had normal colonoscopy/biopsies. He was encouraged to keep symptom/food diary and trial of probiotic. Many sx related to ETOH--encouraged dry January as trial  Encouraged to take probiotic for 4 weeks as a trial.    PMH, PSH, SH reviewed   ROS:    PHYSICAL EXAM:  There were no vitals taken for this visit.  Wt Readings from Last 3 Encounters:  09/16/23 189 lb (85.7 kg)  05/15/23 197 lb  (89.4 kg)  04/15/22 184 lb 6.4 oz (83.6 kg)       ASSESSMENT/PLAN:

## 2024-01-14 ENCOUNTER — Encounter: Payer: Self-pay | Admitting: Family Medicine

## 2024-01-14 ENCOUNTER — Ambulatory Visit (INDEPENDENT_AMBULATORY_CARE_PROVIDER_SITE_OTHER): Admitting: Family Medicine

## 2024-01-14 VITALS — BP 120/72 | HR 72 | Ht 71.0 in | Wt 192.0 lb

## 2024-01-14 DIAGNOSIS — R194 Change in bowel habit: Secondary | ICD-10-CM | POA: Diagnosis not present

## 2024-01-14 NOTE — Patient Instructions (Signed)
  We discussed your change in bowel habits.  None of these are alarming or have red flags to suggest a worrisome underlying reason. You do have a high fiber diet, which might make you go more often than others. You can try eliminating either the juicing or the smoothies to see the impact it has (try it for 2 weeks). Keeping a food journal might be helpful to determine what is contributing. If it doesn't bother you to have 4-5 bowels movements daily (that are soft, not loose, no blood or mucus), then it doesn't bother me. You can always try a reset with a month of a probiotic (such as Align).  I encourage you to cut back on alcohol, limiting to just 2 (max 3, just rarely) drinks at a time. You do not need another colonoscopy until 04/2029 unless new issues arise--unexplained weight loss, abdominal pain, blood in the stools, consistently narrowed stools.  We discussed possible diagnosis of irritable bowel syndrome (bowel changes can sometimes be related to stress, and can fluctuate). I don't suspect any significant IBS that requires treatment, given that you don't have true diarrhea or constipation

## 2024-01-15 ENCOUNTER — Encounter: Payer: Self-pay | Admitting: Family Medicine

## 2024-05-31 NOTE — Progress Notes (Unsigned)
 No chief complaint on file.   Kevin Lowe is a 54 y.o. male who presents for a complete physical.    At his physical last year he was complaining of  having dull cramps--wakes him up in the morning after drinking alcohol, with cramps and needing to go to the bathroom. Sometimes is constipated, sometimes he will pass 4 BM's (normal, not loose, no blood).  Previous routine would be just 1 BM daily after coffee.  No changes to diet when drinking (we had previously discussed unhealthy eating patterns while drinking, states he improved this). He was seen again in September, with similar complaints, not really any different from January, but wanted to make sure he didn't need further eval or repeat colonoscopy. At that time he reported decreasing alcohol to 8-9 drinks/week over a weekend, not drinking during the week. Has flax and chia seeds in his smoothies every night (for a couple of years), along with whey protein, milk, fruit. He eats spinach salads regularly. Juicing ginger and cucumber settles his stomach. Diet remains good. Still having the smoothies at night, juices at night. Salad during the day. In September he reported having a BM at 6am (after going back to bed after voiding, feels his stomach grumble, has to go back to the bathroom for a BM)--this had only started several months prior.  Also having another BM at 8 am. Has another BM after drinking coffee, or after lunch. 4-5 BM's daily over the last year.   Stools are soft, sometimes stringy. Not consistently very narrow. No blood or mucus in the stool.  Occasionally hemorrhoid flares. Sometimes feels like belt is too tight, a sensation, not pain. Doesn't feel bloated.  Appetite is normal.  Sometimes has slight discomfort like when he is very hungry.  He has history of periodic stomach issues--fecal urgency, slight discomfort, and when in restaurants--needs to go to the bathroom before leaving the restaurant. This hasn't  changed.  He has seen GI and had normal colonoscopy in 2020, including negative biopsies of colon. Only internal hemorrhoids, no polyps. His symptoms had previously resolved with probiotic use. Didn't continue regular use of probiotic due to cost, but also wasn't sure how much benefit continued, so he doesn't take any longer.   ***  We had discussed 2 week trial of eliminating either the juicing or the smoothies to see the impact it has, and keeping a food journal. Also suggested trying a reset with a month of a probiotic (such as Align).   He was encouraged to limit alcohol to just 2 drinks at a time (max 3, rarely).  ***  Internal hemorrhoids--he uses Anusol  HC suppositories prn.    H/o mild hyperlipidemia:  Cholesterol has been up and down, related to his diet. In 2020, when LDL 153, he had been eating eating a lot of eggs. Current diet: ***UPDATE 1-2 egg yolks/week, has 10-12 egg whites. Some bleu cheese on a salad, 2% milk in coffee.  No creamy dressings, soups, sauces.  Infrequent red meat (once every 10 days). Rare fried foods (once a month).  No mayo, no butter, just olive oil and avocado oil.  Frozen pizza 1-2x/month. Due for recheck of lipids.  Component Ref Range & Units (hover) 1 yr ago 2 yr ago 5 yr ago 6 yr ago 7 yr ago 8 yr ago 10 yr ago  Cholesterol, Total 189 211 High  223 High  217 High      Triglycerides 131 110 90 70 78 R 141 R  115 R  HDL 42 56 54 56 52 R 48 R 50  VLDL Cholesterol Cal 24 20 16 14      LDL Chol Calc (NIH) 123 High  135 High  153 High       Chol/HDL Ratio 4.5 3.8 CM 4.1 CM 3.9 CM 3.7 R 3.6 R 4.1 R    He was noted to have elevated fasting glucose in 2017, sugar 108. Glucose and A1c were normal in 2018, glucose had been <100 for many years, was back up to 108 last year. He continues to try and eat clean.   Immunization History  Administered Date(s) Administered   Influenza Split 02/19/2012   Influenza Whole 01/16/2011   Influenza,inj,Quad  PF,6+ Mos 01/22/2016, 01/23/2017, 01/29/2018, 02/04/2019, 02/20/2022   Influenza-Unspecified 05/13/2023   PFIZER(Purple Top)SARS-COV-2 Vaccination 08/26/2019, 09/20/2019   Tdap 01/25/2011, 02/20/2022   Last colonoscopy: 04/2019 Dr. Leigh.  Internal hemorrhoids, normal biopsies  Last PSA:   Lab Results  Component Value Date   PSA1 0.5 05/15/2023   PSA1 0.5 02/20/2022   PSA1 0.5 02/04/2019   PSA 0.5 01/23/2017   PSA 0.51 10/06/2013  (had one done in 2017 also, at urologist) Dentist: twice yearly  Ophtho 4-5 years ago, uses readers. *** Exercise:   Goes to the gym 4-5x/week--lifts weights, walks on treadmill for 60 mins. HIIT workouts less often, just once a month.   PMH, PSH, SH and FH were reviewed/updated     ROS: The patient denies anorexia, fever, headaches, vision loss (now uses reading glasses), decreased hearing, ear pain, hoarseness, chest pain, palpitations, dizziness, syncope, dyspnea on exertion, cough, swelling, nausea, vomiting, diarrhea, constipation, melena, hematochezia, indigestion/heartburn, hematuria, incontinence, erectile dysfunction, nocturia, weakened urine stream, dysuria, genital lesions, numbness, tingling, weakness, tremor, suspicious skin lesions, depression, abnormal bleeding/bruising, or enlarged lymph nodes. No further back or neck pain. Some urinary frequency, chronic and unchanged. Up 2-3/night to void. Drinks a lot of water.  Frequent BM's and urgency after eating, per HPI Sees dermatologist (Dr. Rolan Molt)  weight    PHYSICAL EXAM:  There were no vitals taken for this visit.  Wt Readings from Last 3 Encounters:  01/14/24 192 lb (87.1 kg)  09/16/23 189 lb (85.7 kg)  05/15/23 197 lb (89.4 kg)    General Appearance:  Alert, cooperative, no distress, appears stated age   Head:  Normocephalic, without obvious abnormality, atraumatic. Head is shaven. Mildly hypertrophic scar at posterior scalp  Eyes:  PERRL, conjunctiva/corneas clear,  EOM's intact, fundi benign   Ears:  Normal TM and external ear canal on the right, the left was obscured by cerumen  Nose:  Normal, no drainage or sinus tenderness  Throat:  Normal mucosa, no lesions  Neck:  Supple, no lymphadenopathy; thyroid: no enlargement/ tenderness/nodules; no carotid bruit or JVD   Back:  Spine nontender, no curvature, ROM normal, no CVA tenderness.   Lungs:  Clear to auscultation bilaterally without wheezes, rales or ronchi; respirations unlabored   Chest Wall:  No tenderness or deformity   Heart:  Regular rate and rhythm, S1 and S2 normal, no murmur, rub or gallop   Breast Exam:  No chest wall tenderness, masses or gynecomastia   Abdomen:  Soft, non-tender, nondistended, normoactive bowel sounds, no masses, no hepatosplenomegaly   Genitalia:  Normal external genitalia without lesions. Testicles descended bilaterally, no masses.  No inguinal hernias  Rectal:  Normal sphincter tone, no mass, heme negative stool.  Prostate is just mildly enlarged for age, smooth, no nodules or asymmetry  Extremities:  No clubbing, cyanosis or edema   Pulses:  2+ and symmetric all extremities   Skin:  Skin color, texture, turgor normal, no rashes or lesions   Lymph nodes:  Cervical, supraclavicular, and inguinal nodes normal   Neurologic:  CNII-XII intact, normal strength, sensation and gait; reflexes 2+ and symmetric throughout.                  Psych: Normal mood, affect, hygiene and grooming    ASSESSMENT/PLAN:  NOT DONE  Enter/offer/decline flu, covid Prevnar-20 Shingles  Cbc, c-met, PSA A1c (with labs) if weight same as last year or higher, not if lost wt   Recommended at least 30 minutes of aerobic activity at least 5 days/week, weight bearing exercise at least 2x/week; proper sunscreen use reviewed; healthy diet and alcohol recommendations (less than or equal to 2 drinks/day--needs to cut back on weekend alcohol consumption) reviewed; regular seatbelt use; changing  batteries in smoke detectors. Immunization recommendations discussed--continue yearly flu shots. *** Declined COVID booster.  Prevnar-20 *** Discussed shingrix (risks/SE/benefit), and plans to return for NV. Colonoscopy recommendations reviewed, UTD. Discussed concerning symptoms that would be an indication to repeat (or at least f/u with GI)--no red flag symptoms currently. Risks/recommendations for PSA screening discussed.   F/u 1 year, sooner prn.

## 2024-06-01 ENCOUNTER — Encounter: Payer: Self-pay | Admitting: *Deleted

## 2024-06-01 NOTE — Patient Instructions (Incomplete)
" °  HEALTH MAINTENANCE RECOMMENDATIONS:  It is recommended that you get at least 30 minutes of aerobic exercise at least 5 days/week (for weight loss, you may need as much as 60-90 minutes). This can be any activity that gets your heart rate up. This can be divided in 10-15 minute intervals if needed, but try and build up your endurance at least once a week.  Weight bearing exercise is also recommended twice weekly.  Eat a healthy diet with lots of vegetables, fruits and fiber.  Colorful foods have a lot of vitamins (ie green vegetables, tomatoes, red peppers, etc).  Limit sweet tea, regular sodas and alcoholic beverages, all of which has a lot of calories and sugar.  Up to 2 alcoholic drinks daily may be beneficial for men (unless trying to lose weight, watch sugars).  Drink a lot of water.  Sunscreen of at least SPF 30 should be used on all sun-exposed parts of the skin when outside between the hours of 10 am and 4 pm (not just when at beach or pool, but even with exercise, golf, tennis, and yard work!)  Use a sunscreen that says broad spectrum so it covers both UVA and UVB rays, and make sure to reapply every 1-2 hours.  Remember to change the batteries in your smoke detectors when changing your clock times in the spring and fall.  Carbon monoxide detectors are recommended for your home.  Use your seat belt every time you are in a car, and please drive safely and not be distracted with cell phones and texting while driving.  Please try and get your flu shots earlier in the season, rather than waiting for your visit. Next should be gotten (either at a nurse visit at our office or from the pharmacy) in September/October 2026.  I recommend getting the shingles vaccine (Shingrix). This is a series of 2 injections, spaced 2 months apart.  This should be separated from other vaccines by at least 2 weeks.  Please schedule a routine eye exam.  "

## 2024-06-02 ENCOUNTER — Ambulatory Visit (INDEPENDENT_AMBULATORY_CARE_PROVIDER_SITE_OTHER): Payer: BC Managed Care – PPO | Admitting: Family Medicine

## 2024-06-02 ENCOUNTER — Encounter: Payer: Self-pay | Admitting: Family Medicine

## 2024-06-02 VITALS — BP 126/72 | HR 68 | Ht 71.0 in | Wt 190.6 lb

## 2024-06-02 DIAGNOSIS — R0683 Snoring: Secondary | ICD-10-CM | POA: Diagnosis not present

## 2024-06-02 DIAGNOSIS — Z125 Encounter for screening for malignant neoplasm of prostate: Secondary | ICD-10-CM

## 2024-06-02 DIAGNOSIS — Z8249 Family history of ischemic heart disease and other diseases of the circulatory system: Secondary | ICD-10-CM

## 2024-06-02 DIAGNOSIS — Z0000 Encounter for general adult medical examination without abnormal findings: Secondary | ICD-10-CM

## 2024-06-02 DIAGNOSIS — G478 Other sleep disorders: Secondary | ICD-10-CM

## 2024-06-02 DIAGNOSIS — E7800 Pure hypercholesterolemia, unspecified: Secondary | ICD-10-CM

## 2024-06-02 DIAGNOSIS — R5383 Other fatigue: Secondary | ICD-10-CM | POA: Diagnosis not present

## 2024-06-02 DIAGNOSIS — Z23 Encounter for immunization: Secondary | ICD-10-CM

## 2024-06-02 LAB — LIPID PANEL

## 2024-06-03 ENCOUNTER — Other Ambulatory Visit: Payer: Self-pay | Admitting: *Deleted

## 2024-06-03 ENCOUNTER — Ambulatory Visit: Payer: Self-pay | Admitting: Family Medicine

## 2024-06-03 DIAGNOSIS — E7800 Pure hypercholesterolemia, unspecified: Secondary | ICD-10-CM

## 2024-06-03 DIAGNOSIS — R7301 Impaired fasting glucose: Secondary | ICD-10-CM

## 2024-06-03 LAB — CBC WITH DIFFERENTIAL/PLATELET
Basophils Absolute: 0 x10E3/uL (ref 0.0–0.2)
Basos: 0 %
EOS (ABSOLUTE): 0.1 x10E3/uL (ref 0.0–0.4)
Eos: 3 %
Hematocrit: 50 % (ref 37.5–51.0)
Hemoglobin: 16.6 g/dL (ref 13.0–17.7)
Immature Grans (Abs): 0 x10E3/uL (ref 0.0–0.1)
Immature Granulocytes: 0 %
Lymphocytes Absolute: 1.4 x10E3/uL (ref 0.7–3.1)
Lymphs: 37 %
MCH: 31 pg (ref 26.6–33.0)
MCHC: 33.2 g/dL (ref 31.5–35.7)
MCV: 93 fL (ref 79–97)
Monocytes Absolute: 0.4 x10E3/uL (ref 0.1–0.9)
Monocytes: 11 %
Neutrophils Absolute: 1.8 x10E3/uL (ref 1.4–7.0)
Neutrophils: 49 %
Platelets: 245 x10E3/uL (ref 150–450)
RBC: 5.36 x10E6/uL (ref 4.14–5.80)
RDW: 12.2 % (ref 11.6–15.4)
WBC: 3.8 x10E3/uL (ref 3.4–10.8)

## 2024-06-03 LAB — CMP14+EGFR
ALT: 43 IU/L (ref 0–44)
AST: 23 IU/L (ref 0–40)
Albumin: 5 g/dL — AB (ref 3.8–4.9)
Alkaline Phosphatase: 58 IU/L (ref 47–123)
BUN/Creatinine Ratio: 13 (ref 9–20)
BUN: 13 mg/dL (ref 6–24)
Bilirubin Total: 0.5 mg/dL (ref 0.0–1.2)
CO2: 25 mmol/L (ref 20–29)
Calcium: 9.8 mg/dL (ref 8.7–10.2)
Chloride: 98 mmol/L (ref 96–106)
Creatinine, Ser: 0.99 mg/dL (ref 0.76–1.27)
Globulin, Total: 2.5 g/dL (ref 1.5–4.5)
Glucose: 110 mg/dL — AB (ref 70–99)
Potassium: 4.5 mmol/L (ref 3.5–5.2)
Sodium: 138 mmol/L (ref 134–144)
Total Protein: 7.5 g/dL (ref 6.0–8.5)
eGFR: 92 mL/min/1.73

## 2024-06-03 LAB — LIPID PANEL
Cholesterol, Total: 245 mg/dL — AB (ref 100–199)
HDL: 60 mg/dL
LDL CALC COMMENT:: 4.1 ratio (ref 0.0–5.0)
LDL Chol Calc (NIH): 166 mg/dL — AB (ref 0–99)
Triglycerides: 107 mg/dL (ref 0–149)
VLDL Cholesterol Cal: 19 mg/dL (ref 5–40)

## 2024-06-03 LAB — PSA: Prostate Specific Ag, Serum: 0.6 ng/mL (ref 0.0–4.0)

## 2024-06-03 LAB — TESTOSTERONE: Testosterone: 474 ng/dL (ref 264–916)
